# Patient Record
Sex: Female | Born: 1945 | Race: White | Hispanic: No | Smoking: Former smoker
Health system: Southern US, Community
[De-identification: ages and names within clinical notes are randomized; demographics above are authoritative.]

## PROBLEM LIST (undated history)

## (undated) DIAGNOSIS — L900 Lichen sclerosus et atrophicus: Secondary | ICD-10-CM

## (undated) DIAGNOSIS — M8580 Other specified disorders of bone density and structure, unspecified site: Secondary | ICD-10-CM

## (undated) DIAGNOSIS — C801 Malignant (primary) neoplasm, unspecified: Secondary | ICD-10-CM

## (undated) DIAGNOSIS — L409 Psoriasis, unspecified: Secondary | ICD-10-CM

## (undated) HISTORY — DX: Other specified disorders of bone density and structure, unspecified site: M85.80

## (undated) HISTORY — DX: Psoriasis, unspecified: L40.9

## (undated) HISTORY — PX: TUBAL LIGATION: SHX77

## (undated) HISTORY — DX: Malignant (primary) neoplasm, unspecified: C80.1

## (undated) HISTORY — DX: Lichen sclerosus et atrophicus: L90.0

## (undated) HISTORY — PX: BREAST SURGERY: SHX581

## (undated) HISTORY — PX: TONSILLECTOMY: SUR1361

---

## 1999-04-01 ENCOUNTER — Other Ambulatory Visit: Admission: RE | Admit: 1999-04-01 | Discharge: 1999-04-01 | Payer: Self-pay | Admitting: Obstetrics and Gynecology

## 1999-06-02 ENCOUNTER — Inpatient Hospital Stay (HOSPITAL_COMMUNITY): Admission: EM | Admit: 1999-06-02 | Discharge: 1999-06-03 | Payer: Self-pay | Admitting: Emergency Medicine

## 1999-06-02 ENCOUNTER — Encounter: Payer: Self-pay | Admitting: Emergency Medicine

## 1999-06-02 ENCOUNTER — Encounter: Payer: Self-pay | Admitting: Cardiology

## 1999-07-08 ENCOUNTER — Encounter: Payer: Self-pay | Admitting: Family Medicine

## 1999-07-08 ENCOUNTER — Encounter: Admission: RE | Admit: 1999-07-08 | Discharge: 1999-07-08 | Payer: Self-pay | Admitting: Family Medicine

## 2001-04-05 ENCOUNTER — Other Ambulatory Visit: Admission: RE | Admit: 2001-04-05 | Discharge: 2001-04-05 | Payer: Self-pay | Admitting: Obstetrics and Gynecology

## 2002-04-06 ENCOUNTER — Other Ambulatory Visit: Admission: RE | Admit: 2002-04-06 | Discharge: 2002-04-06 | Payer: Self-pay | Admitting: *Deleted

## 2003-04-09 ENCOUNTER — Other Ambulatory Visit: Admission: RE | Admit: 2003-04-09 | Discharge: 2003-04-09 | Payer: Self-pay | Admitting: Obstetrics and Gynecology

## 2004-04-10 ENCOUNTER — Other Ambulatory Visit: Admission: RE | Admit: 2004-04-10 | Discharge: 2004-04-10 | Payer: Self-pay | Admitting: Obstetrics and Gynecology

## 2004-04-23 ENCOUNTER — Encounter (INDEPENDENT_AMBULATORY_CARE_PROVIDER_SITE_OTHER): Payer: Self-pay | Admitting: Specialist

## 2004-04-23 ENCOUNTER — Ambulatory Visit (HOSPITAL_COMMUNITY): Admission: RE | Admit: 2004-04-23 | Discharge: 2004-04-23 | Payer: Self-pay | Admitting: Gastroenterology

## 2005-04-23 ENCOUNTER — Other Ambulatory Visit: Admission: RE | Admit: 2005-04-23 | Discharge: 2005-04-23 | Payer: Self-pay | Admitting: Obstetrics and Gynecology

## 2006-04-26 ENCOUNTER — Other Ambulatory Visit: Admission: RE | Admit: 2006-04-26 | Discharge: 2006-04-26 | Payer: Self-pay | Admitting: Obstetrics and Gynecology

## 2006-10-19 ENCOUNTER — Encounter: Admission: RE | Admit: 2006-10-19 | Discharge: 2006-10-19 | Payer: Self-pay | Admitting: Family Medicine

## 2007-04-28 ENCOUNTER — Other Ambulatory Visit: Admission: RE | Admit: 2007-04-28 | Discharge: 2007-04-28 | Payer: Self-pay | Admitting: Obstetrics and Gynecology

## 2008-05-09 ENCOUNTER — Encounter: Payer: Self-pay | Admitting: Obstetrics and Gynecology

## 2008-05-09 ENCOUNTER — Other Ambulatory Visit: Admission: RE | Admit: 2008-05-09 | Discharge: 2008-05-09 | Payer: Self-pay | Admitting: Obstetrics and Gynecology

## 2008-05-09 ENCOUNTER — Ambulatory Visit: Payer: Self-pay | Admitting: Obstetrics and Gynecology

## 2009-05-10 ENCOUNTER — Ambulatory Visit: Payer: Self-pay | Admitting: Obstetrics and Gynecology

## 2009-05-10 ENCOUNTER — Other Ambulatory Visit: Admission: RE | Admit: 2009-05-10 | Discharge: 2009-05-10 | Payer: Self-pay | Admitting: Obstetrics and Gynecology

## 2009-05-27 ENCOUNTER — Encounter: Admission: RE | Admit: 2009-05-27 | Discharge: 2009-05-27 | Payer: Self-pay | Admitting: Family Medicine

## 2010-01-13 ENCOUNTER — Ambulatory Visit (HOSPITAL_COMMUNITY): Admission: RE | Admit: 2010-01-13 | Discharge: 2010-01-13 | Payer: Self-pay | Admitting: Dermatology

## 2010-05-07 LAB — CBC
HCT: 31.7 % — ABNORMAL LOW (ref 36.0–46.0)
Hemoglobin: 9.8 g/dL — ABNORMAL LOW (ref 12.0–15.0)
MCH: 33.1 pg (ref 26.0–34.0)
MCHC: 30.9 g/dL (ref 30.0–36.0)
MCV: 107.1 fL — ABNORMAL HIGH (ref 78.0–100.0)
Platelets: 320 10*3/uL (ref 150–400)
RBC: 2.96 MIL/uL — ABNORMAL LOW (ref 3.87–5.11)
RDW: 22.3 % — ABNORMAL HIGH (ref 11.5–15.5)
WBC: 5.6 10*3/uL (ref 4.0–10.5)

## 2010-07-11 NOTE — H&P (Signed)
[facility]  Patient:    Sharon Guerra, Sharon Guerra                      MRN: [id number] Adm. Date:  [id number] Attending:  Doug Sou CC:         Redmond Baseman, M.D.                         History and Physical  CHIEF COMPLAINT:  Chest pain.  HISTORY OF PRESENT ILLNESS:  [name] is a 65 year old female who awoke suddenly at 5 a.m. this morning with substernal chest pain, described as severe  10/10.  She took Zantac and Maalox without relief.  She presented to the emergency room.  The substernal chest pain was associated with nausea and diaphoresis. She has not had any similar episodes of chest pain in the past.  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  She is not involved in an exercise program.  The coronary isk factors include postmenopausal status, a sedentary life style, positive family history.  Cholesterol is unknown.  FAMILY HISTORY:  Her father died at age 52 of a myocardial infarction.  Her grandmother died of sudden cardiac death.  PAST MEDICAL HISTORY:  Hypercholesterolemia.  CURRENT MEDICATIONS:  Evista 60 mg p.o. q.d.  ALLERGIES:  PENICILLIN.   She has hives.  PAST SURGICAL HISTORY:  Significant for a lumpectomy for breast cancer in 1997.  REVIEW OF SYSTEMS:  Significant for diaphoresis, occasional palpitations, but no tachyarrhythmia.  No pedal edema.  No orthopnea.  Presyncope in the past but no  syncope.  She is not involved in an exercise program.  No bright red blood per rectum or black tarry stools.  No hematemesis.  SOCIAL HISTORY:  She is married.  She has two children who are in good health. No history of alcohol, tobacco, or other drugs of abuse.  PHYSICAL EXAMINATION:  GENERAL:  A middle-aged female, currently pain-free.  VITAL SIGNS:  Blood pressure 138/60, heart rate 74.  She is afebrile.  HEENT:  Unremarkable.  NECK:  No neck vein distention, no carotid bruits.  Thyroid is not  palpable.  LUNGS:  Breath sounds equal and clear to auscultation.  CARDIOVASCULAR:  A normal S1, normal S2.  A regular rate and rhythm.  No rubs, murmurs, or gallops noted.  ABDOMEN:  Soft, benign.  Normal bowel sounds.  No epigastric tenderness.  EXTREMITIES:  Do not reveal any peripheral edema.  Distal pulses are palpable.  NEUROLOGIC:  Nonfocal.  SKIN:  Warm and dry.  NEUROLOGIC:  Motor is 5/5 throughout.  LABORATORY DATA:  Creatinine is 0.8, sodium 143, potassium 4.0, pH of 7.39. Hematocrit 42, white count 6.1, platelet count 224.  CPK total 81, CPK-MB 0.7, troponin I less than 0.03.  O2 saturation is 97% on room air.  Electrocardiogram reveals a normal sinus rhythm, normal electrocardiogram.  Chest x-ray:  No cardiomegaly.  Normal chest x-ray.  IMPRESSION: 1. Substernal chest pain in a middle-aged female with significant risk factors    for coronary artery disease, including a family history, postmenopausal status,    and age.  PLAN:  She will be admitted to the chest pain unit and a myocardial infarction ill be ruled out by cardiac enzymes.  If the serial cardiac enzymes are negative, a  stress Cardiolite will be planned for further evaluation.  Consider gastritis as the source.  The patient will be started on  Protonix 30 mg p.o. q.d.  2. Status post breast cancer.  PLAN:  She will be continued on her Evista.  In view of her normal electrocardiogram and normal cardiac enzymes, a heparin drip will not be started at this time.  HEALTH MAINTENANCE:  Fasting lipid profile, liver profile will be obtained. Further workup will be planned per the outcome of the above data. DD:  06/02/99 TD:  06/01/99 Job: [id number]

## 2010-07-11 NOTE — Op Note (Signed)
NAMEKHAYLEE, Sharon Guerra NO.:  [id number]   MEDICAL RECORD NO.:  [id number]          PATIENT TYPE:  AMB   LOCATION:  ENDO                         FACILITY[facility]  [facility]   PHYSICIAN:  James L. Malon Kindle., M.D.DATE OF BIRTH:  November 22, 1945   DATE OF PROCEDURE:  04/23/2004  DATE OF DISCHARGE:                                 OPERATIVE REPORT   PROCEDURE:  Esophagogastroduodenoscopy with biopsy.   MEDICATIONS:  Fentanyl 75 mcg, Versed 6 mg IV.   INDICATIONS FOR PROCEDURE:  Iron deficiency anemia of unknown cause.   DESCRIPTION OF PROCEDURE:  The procedure had been explained to the patient  and consent obtained.  In the left lateral decubitus position, the Olympus  scope was inserted and advanced. We were then able to advance easily into  the stomach. The gastric outlet identified and passed, the duodenum  including the bulb and second portion were seen well.  The scope was  withdrawn back into the stomach.  The small bowel mucosa had been entirely  normal and random biopsies were obtained to look for celiac disease. The  scope was withdrawn and the antrum and body were normal, fundus and cardia  seen well and were normal.  There were no ulcerations or other  abnormalities.  The patient had really minimal, if any, hiatal hernia, the  distal and proximal esophagus were endoscopically normal. The vocal cords  were seen well upon removal and were normal. The scope was withdrawn and  patient tolerated the procedure well.   ASSESSMENT:  Iron deficiency anemia, 280.0. No obvious cause on upper  endoscopy.   PLAN:  Will check path to rule out celiac disease and proceed with  colonoscopy at this time as planned.      JLE/MEDQ  D:  04/23/2004  T:  04/23/2004  Job:  [id number]   cc:   Rande Brunt. Eda Paschal, M.D.  [address], [address]  [place]  [place] [address]  Fax: [phone]   L. Lupe Carney, M.D.  [address] [place]  [place] [address]  Fax: [phone]

## 2010-07-11 NOTE — Op Note (Signed)
NAMESILVIE, OBREMSKI NO.:  [id number]   MEDICAL RECORD NO.:  [id number]          PATIENT TYPE:  AMB   LOCATION:  ENDO                         [facility]AC[facility]   PHYSICIAN:  James L. Malon Kindle., M.D.DATE OF BIRTH:  26-Mar-1945   DATE OF PROCEDURE:  04/23/2004  DATE OF DISCHARGE:                                 OPERATIVE REPORT   PROCEDURE:  Colonoscopy.   MEDICATIONS:  The patient received a total of fentanyl 100 mcg and Versed 10  mg IV for both procedures.   SCOPE:  Olympus pediatric colonoscope.   INDICATIONS FOR PROCEDURE:  Iron deficiency anemia with a family history of  colon cancer.   DESCRIPTION OF PROCEDURE:  The procedure had been explained to the patient  and consent obtained. An endoscopy was performed just before this procedure  was normal. The Olympus pediatric adjustable scope was inserted and  advanced. The prep was excellent. We were able to easily reach the cecum.  The ileocecal valve and appendiceal orifice were seen. The scope was  withdrawn and the cecum, ascending colon, transverse colon, splenic flexure,  descending and sigmoid colon were seen well, no polyps or other lesions were  seen. There was no diverticulosis.  The scope was withdrawn down in the  rectum, the rectum was free of polyps. The patient tolerated the procedure  well.   ASSESSMENT:  1.  Iron deficiency anemia, negative colonoscopy at this time.  2.  Family history of colon cancer, V16.0.   PLAN:  Will place the patient on slow FE b.i.d., see back in the office in 6-  8 weeks.  Recheck stools, iron studies and will likely recommend repeating  colonoscopy in five years due to her family history.      JLE/MEDQ  D:  04/23/2004  T:  04/23/2004  Job:  [id number]   cc:   Rande Brunt. Eda Paschal, M.D.  [address], [address]  [place]  [place] [address]  Fax: [phone]   L. Lupe Carney, M.D.  [address] [place]  [place] [address]  Fax: [phone]

## 2010-07-11 NOTE — Cardiovascular Report (Signed)
[place] [place] [facility]  Patient:    Sharon Guerra, Sharon Guerra                      MRN: [id number] Proc. Date: 06/03/99 Adm. Date:  [id number] Disc. Date: [id number] Attending:  Meade Maw A                        Cardiac Catheterization  PROCEDURE PERFORMED:  Left heart catheterization.  INDICATIONS FOR PROCEDURE:  Recurrent chest pain, significant cardiac risk factors.  DESCRIPTION OF PROCEDURE:  After obtaining written informed consent, and explaining options, the patient was brought to the cardiac catheterization lab in the postabsorptive state.  Preop sedation was achieved with IV Versed.  The right groin was prepped and draped in usual sterile fashion.   Local anesthesia was achieved using 1% Xylocaine.  A 6 French hemostasis sheath was placed into the right femoral artery using a modified Seldinger technique.  Selective coronary angiography was performed using the JL4, JR4 Judkins catheters.  Nonionic contrast was used and was hand injected.  Single plane ventriculogram was performed in the RAO position using a 6 French pigtail curved catheter.  Nonionic contrast was used for power injection.  The hemostasis sheath was flushed after each catheter exchange. All catheter exchange were made over guidewire.  On long review of the film, there as no identifiable coronary artery disease.  The hemostasis sheath was removed. Hemostasis was achieved using digital pressure.  FINDINGS:  Aorta pressure is 140/71, LV pressure is 140/9.  Single plane ventriculogram revealed normal wall motion with an ejection fraction of 65%.  CORONARY ANGIOGRAPHY:  Fluoroscopy revealed no significant calcification.  Left main coronary artery:  The left main coronary artery bifurcated into the left anterior descending and circumflex vessels.  There is no significant disease in the left main coronary artery.  Left anterior descending:  Left anterior descending gave rise to a  large diagonal #1 and large diagonal #2 and it is an apical recurrent branch.  There was no significant disease in the left anterior descending.  Circumflex vessel:  The circumflex vessel gave rise to a large OM-1, moderate OM-2 and it ______ vessels.  There was no significant disease in the circumflex or its vessels.  Right coronary artery:  The right coronary artery gave rise to a small RV marginal #1, moderate PDA and a PL branch.  There was no significant disease in the right coronary artery or its branches.  IMPRESSION:  Normal epicardial arteries, normal left ventricular function. Ejection fraction was approximately 65%.  RECOMMENDATIONS:  To consider other etiologies for chest pain.  The patient was  started on Protonix 40 mg p.o. q.d. DD:  06/03/99 TD:  06/04/99 Job: [id number]

## 2011-10-07 ENCOUNTER — Encounter: Payer: Self-pay | Admitting: Gynecology

## 2011-10-07 DIAGNOSIS — C801 Malignant (primary) neoplasm, unspecified: Secondary | ICD-10-CM | POA: Insufficient documentation

## 2011-10-07 DIAGNOSIS — M8580 Other specified disorders of bone density and structure, unspecified site: Secondary | ICD-10-CM | POA: Insufficient documentation

## 2011-10-07 DIAGNOSIS — L409 Psoriasis, unspecified: Secondary | ICD-10-CM | POA: Insufficient documentation

## 2011-10-14 DIAGNOSIS — Z1231 Encounter for screening mammogram for malignant neoplasm of breast: Secondary | ICD-10-CM | POA: Diagnosis not present

## 2011-10-15 ENCOUNTER — Encounter: Payer: Self-pay | Admitting: Obstetrics and Gynecology

## 2011-10-19 ENCOUNTER — Encounter: Payer: Self-pay | Admitting: Obstetrics and Gynecology

## 2011-10-19 ENCOUNTER — Ambulatory Visit (INDEPENDENT_AMBULATORY_CARE_PROVIDER_SITE_OTHER): Payer: Medicare Other | Admitting: Obstetrics and Gynecology

## 2011-10-19 VITALS — BP 130/80 | Ht 63.0 in | Wt 209.0 lb

## 2011-10-19 DIAGNOSIS — R351 Nocturia: Secondary | ICD-10-CM

## 2011-10-19 DIAGNOSIS — M8580 Other specified disorders of bone density and structure, unspecified site: Secondary | ICD-10-CM

## 2011-10-19 DIAGNOSIS — M899 Disorder of bone, unspecified: Secondary | ICD-10-CM | POA: Diagnosis not present

## 2011-10-19 DIAGNOSIS — M949 Disorder of cartilage, unspecified: Secondary | ICD-10-CM

## 2011-10-19 NOTE — Progress Notes (Signed)
Patient came to see me today for her annual GYN exam. 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 She said she stopped it a year ago for no apparent reason. She just had a mammogram. She is having no vaginal bleeding. She is having no pelvic pain. 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 She has had normal Pap smears since then. Her last Pap smear was 2012. She was doing bone densities at [facility] radiology.her last bone density was 2009. She was on Evista at that time. Her worst T score was -2.2. She is doing well without hormone replacement therapy. She was seeing  Dr. Mayford Knife for her psoriasis but has seen no one since he retired.  Physical examination:Kim Julian Reil present.HEENT within normal limits. Neck: Thyroid not large. No masses. Supraclavicular nodes: not enlarged. Breasts: Examined in both sitting and lying  position. No skin changes and no masses. Abdomen: Soft no guarding rebound or masses or hernia. Pelvic: External: Within normal limits. BUS: Within normal limits. Vaginal:within normal limits. Good estrogen effect. No evidence of cystocele rectocele or enterocele. Cervix: clean. Uterus: Normal size and shape. Adnexa: No masses. Rectovaginal exam: Confirmatory and negative. Extremities: Within normal limits.  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 #2. Osteopenia #3. History of CIN.  Plan: Continue yearly mammograms. Schedule bone density. Patient to start to see Dr. Yetta Barre for her skin. The new Pap smear guidelines were discussed with the patient. No pap done.

## 2011-10-19 NOTE — Patient Instructions (Signed)
Schedule bone density.    

## 2011-10-20 LAB — URINALYSIS W MICROSCOPIC + REFLEX CULTURE
Bilirubin Urine: NEGATIVE
Casts: NONE SEEN
Glucose, UA: NEGATIVE mg/dL
Hgb urine dipstick: NEGATIVE
pH: 5.5 (ref 5.0–8.0)

## 2011-10-22 ENCOUNTER — Other Ambulatory Visit: Payer: Self-pay | Admitting: Obstetrics and Gynecology

## 2011-10-22 DIAGNOSIS — N390 Urinary tract infection, site not specified: Secondary | ICD-10-CM

## 2011-10-22 MED ORDER — NITROFURANTOIN MONOHYD MACRO 100 MG PO CAPS: 100.0000 mg | ORAL_CAPSULE | Freq: Two times a day (BID) | ORAL | Status: AC

## 2011-10-23 ENCOUNTER — Encounter: Payer: Self-pay | Admitting: Obstetrics and Gynecology

## 2011-11-10 ENCOUNTER — Other Ambulatory Visit: Payer: Medicare Other

## 2011-11-10 ENCOUNTER — Ambulatory Visit (INDEPENDENT_AMBULATORY_CARE_PROVIDER_SITE_OTHER): Payer: Medicare Other

## 2011-11-10 DIAGNOSIS — M899 Disorder of bone, unspecified: Secondary | ICD-10-CM

## 2011-11-10 DIAGNOSIS — N390 Urinary tract infection, site not specified: Secondary | ICD-10-CM | POA: Diagnosis not present

## 2011-11-10 DIAGNOSIS — M949 Disorder of cartilage, unspecified: Secondary | ICD-10-CM | POA: Diagnosis not present

## 2011-11-10 DIAGNOSIS — M8580 Other specified disorders of bone density and structure, unspecified site: Secondary | ICD-10-CM

## 2011-11-11 LAB — URINALYSIS W MICROSCOPIC + REFLEX CULTURE
Bilirubin Urine: NEGATIVE
Casts: NONE SEEN
Crystals: NONE SEEN
Ketones, ur: NEGATIVE mg/dL
Specific Gravity, Urine: 1.023 (ref 1.005–1.030)
WBC, UA: >50 WBC/hpf — AB (ref <3)
pH: 5 (ref 5.0–8.0)

## 2012-07-20 DIAGNOSIS — L408 Other psoriasis: Secondary | ICD-10-CM | POA: Diagnosis not present

## 2012-10-10 ENCOUNTER — Encounter: Payer: Self-pay | Admitting: Gynecology

## 2012-10-10 ENCOUNTER — Ambulatory Visit (INDEPENDENT_AMBULATORY_CARE_PROVIDER_SITE_OTHER): Payer: Medicare Other | Admitting: Gynecology

## 2012-10-10 VITALS — BP 128/78

## 2012-10-10 DIAGNOSIS — N9089 Other specified noninflammatory disorders of vulva and perineum: Secondary | ICD-10-CM | POA: Insufficient documentation

## 2012-10-10 DIAGNOSIS — L293 Anogenital pruritus, unspecified: Secondary | ICD-10-CM | POA: Diagnosis not present

## 2012-10-10 DIAGNOSIS — B3749 Other urogenital candidiasis: Secondary | ICD-10-CM

## 2012-10-10 DIAGNOSIS — R300 Dysuria: Secondary | ICD-10-CM | POA: Diagnosis not present

## 2012-10-10 DIAGNOSIS — N898 Other specified noninflammatory disorders of vagina: Secondary | ICD-10-CM

## 2012-10-10 DIAGNOSIS — L940 Localized scleroderma [morphea]: Secondary | ICD-10-CM | POA: Diagnosis not present

## 2012-10-10 LAB — URINALYSIS W MICROSCOPIC + REFLEX CULTURE
Crystals: NONE SEEN
Ketones, ur: NEGATIVE mg/dL
Nitrite: NEGATIVE
Specific Gravity, Urine: 1.025 (ref 1.005–1.030)
Urobilinogen, UA: 1 mg/dL (ref 0.0–1.0)

## 2012-10-10 LAB — WET PREP FOR TRICH, YEAST, CLUE: Clue Cells Wet Prep HPF POC: NONE SEEN

## 2012-10-10 MED ORDER — URIBEL 118 MG PO CAPS: 118.0000 mg | ORAL_CAPSULE | Freq: Four times a day (QID) | ORAL | Status: DC

## 2012-10-10 MED ORDER — NITROFURANTOIN MONOHYD MACRO 100 MG PO CAPS: 100.0000 mg | ORAL_CAPSULE | Freq: Two times a day (BID) | ORAL | Status: DC

## 2012-10-10 MED ORDER — CLOBETASOL PROPIONATE 0.05 % EX CREA: TOPICAL_CREAM | Freq: Two times a day (BID) | CUTANEOUS | Status: DC

## 2012-10-10 NOTE — Progress Notes (Signed)
67 year old patient who presented to the office complaining of urinary frequency and dysuria and not completely empty her bladder at times. She denied any back pain, fever, chills, nausea, or vomiting. Patient also complaining of significant amount of vulvar pruritus but no discharge.  Physical Exam  Genitourinary:     speculum exam only demonstrated vaginal atrophy but no true lesion. Wet prep was negative. A vulvar biopsy was obtained from the inferior portion of the right labia majora after the area was cleansed with Betadine solution. One percent lidocaine was infiltrated subdermally and a keypunch biopsy was undertaken specimen was submitted for pathological evaluation. Silver nitrate was used for hemostasis.  Urinalysis: The WBC 7-10, RBC 11-20, bacteria few  Assessment/plan: #1 urinary tract infection will be treated with Macrobid one by mouth twice a day for 7 days. Prescription for Uribel one by mouth 4 times a day for 2 days for her dysuria. 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 She is scheduled to see me next month for her annual exam and we will reassess again. Will notify her as soon as the results of the biopsy are available.

## 2012-10-10 NOTE — Patient Instructions (Addendum)
Lichen Sclerosus Lichen sclerosus is a skin problem. It can happen on any part of the body, but it commonly involves the anal or genital areas. Lichen sclerosus is not an infection or a fungus. Girls and women are more commonly affected than boys and men. CAUSES The cause is not known. It could be the result of an overactive immune system or a lack of certain hormones. Lichen sclerosus is not passed from one person to another (not contagious). SYMPTOMS Your skin may have:  Thin, wrinkled, white areas.  Thickened white areas.  Red and swollen patches.  Tears or cracks.  Bruising.  Blood blisters.  Severe itching. You may also have pain, itching, or burning with urination. Constipation is also common in people with lichen sclerosus. DIAGNOSIS Your caregiver will do a physical exam. Sometimes, a tissue sample (biopsy) may be sent for testing. TREATMENT Treatment may involve putting a thin layer of medicated cream (topical steroid) over the areas with lichen sclerosus. Use the cream only as directed by your caregiver.  HOME CARE INSTRUCTIONS  Only take over-the-counter or prescription medicines as directed by your caregiver.  Keep the vaginal area as clean and dry as possible. SEEK MEDICAL CARE IF: You develop increasing pain, swelling, or redness. Document Released: 07/02/2010 Document Revised: 05/04/2011 Document Reviewed: 07/02/2010 [facility] Patient Information 2014 [place], [place].  Urinary Tract Infection Urinary tract infections (UTIs) can develop anywhere along your urinary tract. Your urinary tract is your body's drainage system for removing wastes and extra water. Your urinary tract includes two kidneys, two ureters, a bladder, and a urethra. Your kidneys are a pair of bean-shaped organs. Each kidney is about the size of your fist. They are located below your ribs, one on each side of your spine. CAUSES Infections are caused by microbes, which are microscopic organisms,  including fungi, viruses, and bacteria. These organisms are so small that they can only be seen through a microscope. Bacteria are the microbes that most commonly cause UTIs. SYMPTOMS  Symptoms of UTIs may vary by age and gender of the patient and by the location of the infection. Symptoms in young women typically include a frequent and intense urge to urinate and a painful, burning feeling in the bladder or urethra during urination. Older women and men are more likely to be tired, shaky, and weak and have muscle aches and abdominal pain. A fever may mean the infection is in your kidneys. Other symptoms of a kidney infection include pain in your back or sides below the ribs, nausea, and vomiting. DIAGNOSIS To diagnose a UTI, your caregiver will ask you about your symptoms. Your caregiver also will ask to provide a urine sample. The urine sample will be tested for bacteria and white blood cells. White blood cells are made by your body to help fight infection. TREATMENT  Typically, UTIs can be treated with medication. Because most UTIs are caused by a bacterial infection, they usually can be treated with the use of antibiotics. The choice of antibiotic and length of treatment depend on your symptoms and the type of bacteria causing your infection. HOME CARE INSTRUCTIONS  If you were prescribed antibiotics, take them exactly as your caregiver instructs you. Finish the medication even if you feel better after you have only taken some of the medication.  Drink enough water and fluids to keep your urine clear or pale yellow.  Avoid caffeine, tea, and carbonated beverages. They tend to irritate your bladder.  Empty your bladder often. Avoid holding urine for  long periods of time.  Empty your bladder before and after sexual intercourse.  After a bowel movement, women should cleanse from front to back. Use each tissue only once. SEEK MEDICAL CARE IF:   You have back pain.  You develop a fever.  Your  symptoms do not begin to resolve within 3 days. SEEK IMMEDIATE MEDICAL CARE IF:   You have severe back pain or lower abdominal pain.  You develop chills.  You have nausea or vomiting.  You have continued burning or discomfort with urination. MAKE SURE YOU:   Understand these instructions.  Will watch your condition.  Will get help right away if you are not doing well or get worse. Document Released: 11/19/2004 Document Revised: 08/11/2011 Document Reviewed: 03/20/2011 [facility] Patient Information 2014 [place], [place].

## 2012-10-14 DIAGNOSIS — Z1231 Encounter for screening mammogram for malignant neoplasm of breast: Secondary | ICD-10-CM | POA: Diagnosis not present

## 2012-10-17 ENCOUNTER — Encounter: Payer: Self-pay | Admitting: Gynecology

## 2012-10-21 DIAGNOSIS — L940 Localized scleroderma [morphea]: Secondary | ICD-10-CM | POA: Diagnosis not present

## 2012-10-21 DIAGNOSIS — L570 Actinic keratosis: Secondary | ICD-10-CM | POA: Diagnosis not present

## 2012-10-21 DIAGNOSIS — L408 Other psoriasis: Secondary | ICD-10-CM | POA: Diagnosis not present

## 2012-10-25 DIAGNOSIS — L408 Other psoriasis: Secondary | ICD-10-CM | POA: Diagnosis not present

## 2012-11-04 ENCOUNTER — Encounter: Payer: Self-pay | Admitting: Gynecology

## 2012-11-04 ENCOUNTER — Ambulatory Visit (INDEPENDENT_AMBULATORY_CARE_PROVIDER_SITE_OTHER): Payer: Medicare Other | Admitting: Gynecology

## 2012-11-04 VITALS — BP 128/88 | Ht 60.5 in | Wt 213.0 lb

## 2012-11-04 DIAGNOSIS — Z853 Personal history of malignant neoplasm of breast: Secondary | ICD-10-CM

## 2012-11-04 DIAGNOSIS — L940 Localized scleroderma [morphea]: Secondary | ICD-10-CM | POA: Diagnosis not present

## 2012-11-04 DIAGNOSIS — Z23 Encounter for immunization: Secondary | ICD-10-CM

## 2012-11-04 DIAGNOSIS — M899 Disorder of bone, unspecified: Secondary | ICD-10-CM | POA: Diagnosis not present

## 2012-11-04 DIAGNOSIS — L408 Other psoriasis: Secondary | ICD-10-CM | POA: Diagnosis not present

## 2012-11-04 DIAGNOSIS — L900 Lichen sclerosus et atrophicus: Secondary | ICD-10-CM | POA: Insufficient documentation

## 2012-11-04 DIAGNOSIS — Z1159 Encounter for screening for other viral diseases: Secondary | ICD-10-CM | POA: Diagnosis not present

## 2012-11-04 DIAGNOSIS — M8580 Other specified disorders of bone density and structure, unspecified site: Secondary | ICD-10-CM

## 2012-11-04 DIAGNOSIS — K5900 Constipation, unspecified: Secondary | ICD-10-CM

## 2012-11-04 DIAGNOSIS — L409 Psoriasis, unspecified: Secondary | ICD-10-CM

## 2012-11-04 LAB — HEPATITIS C ANTIBODY: HCV Ab: NEGATIVE

## 2012-11-04 MED ORDER — LINACLOTIDE 145 MCG PO CAPS: 145.0000 ug | ORAL_CAPSULE | Freq: Every day | ORAL | Status: DC

## 2012-11-04 NOTE — Patient Instructions (Addendum)
Shingles Shingles (herpes zoster) is an infection that is caused by the same virus that causes chickenpox (varicella). The infection causes a painful skin rash and fluid-filled blisters, which eventually break open, crust over, and heal. It may occur in any area of the body, but it usually affects only one side of the body or face. The pain of shingles usually lasts about 1 month. However, some people with shingles may develop long-term (chronic) pain in the affected area of the body. Shingles often occurs many years after the person had chickenpox. It is more common:  In people older than 50 years.  In people with weakened immune systems, such as those with HIV, AIDS, or cancer.  In people taking medicines that weaken the immune system, such as transplant medicines.  In people under great stress. CAUSES  Shingles is caused by the varicella zoster virus (VZV), which also causes chickenpox. After a person is infected with the virus, it can remain in the person's body for years in an inactive state (dormant). To cause shingles, the virus reactivates and breaks out as an infection in a nerve root. The virus can be spread from person to person (contagious) through contact with open blisters of the shingles rash. It will only spread to people who have not had chickenpox. When these people are exposed to the virus, they may develop chickenpox. They will not develop shingles. Once the blisters scab over, the person is no longer contagious and cannot spread the virus to others. SYMPTOMS  Shingles shows up in stages. The initial symptoms may be pain, itching, and tingling in an area of the skin. This pain is usually described as burning, stabbing, or throbbing.In a few days or weeks, a painful red rash will appear in the area where the pain, itching, and tingling were felt. The rash is usually on one side of the body in a band or belt-like pattern. Then, the rash usually turns into fluid-filled blisters. They  will scab over and dry up in approximately 2 3 weeks. Flu-like symptoms may also occur with the initial symptoms, the rash, or the blisters. These may include:  Fever.  Chills.  Headache.  Upset stomach. DIAGNOSIS  Your caregiver will perform a skin exam to diagnose shingles. Skin scrapings or fluid samples may also be taken from the blisters. This sample will be examined under a microscope or sent to a lab for further testing. TREATMENT  There is no specific cure for shingles. Your caregiver will likely prescribe medicines to help you manage the pain, recover faster, and avoid long-term problems. This may include antiviral drugs, anti-inflammatory drugs, and pain medicines. HOME CARE INSTRUCTIONS   Take a cool bath or apply cool compresses to the area of the rash or blisters as directed. This may help with the pain and itching.   Only take over-the-counter or prescription medicines as directed by your caregiver.   Rest as directed by your caregiver.  Keep your rash and blisters clean with mild soap and cool water or as directed by your caregiver.  Do not pick your blisters or scratch your rash. Apply an anti-itch cream or numbing creams to the affected area as directed by your caregiver.  Keep your shingles rash covered with a loose bandage (dressing).  Avoid skin contact with:  Babies.   Pregnant women.   Children with eczema.   Elderly people with transplants.   People with chronic illnesses, such as leukemia or AIDS.   Wear loose-fitting clothing to help ease   the pain of material rubbing against the rash.  Keep all follow-up appointments with your caregiver.If the area involved is on your face, you may receive a referral for follow-up to a specialist, such as an eye doctor (ophthalmologist) or an ear, nose, and throat (ENT) doctor. Keeping all follow-up appointments will help you avoid eye complications, chronic pain, or disability.  SEEK IMMEDIATE MEDICAL  CARE IF:   You have facial pain, pain around the eye area, or loss of feeling on one side of your face.  You have ear pain or ringing in your ear.  You have loss of taste.  Your pain is not relieved with prescribed medicines.   Your redness or swelling spreads.   You have more pain and swelling.  Your condition is worsening or has changed.   You have a feveror persistent symptoms for more than 2 3 days.  You have a fever and your symptoms suddenly get worse. MAKE SURE YOU:  Understand these instructions.  Will watch your condition.  Will get help right away if you are not doing well or get worse. Document Released: 02/09/2005 Document Revised: 11/04/2011 Document Reviewed: 09/24/2011 [facility] Patient Information 2014 [place], [place]. Tetanus, Diphtheria, Pertussis (Tdap) Vaccine What You Need to Know WHY GET VACCINATED? Tetanus, diphtheria and pertussis can be very serious diseases, even for adolescents and adults. Tdap vaccine can protect [place] from these diseases. TETANUS (Lockjaw) causes painful muscle tightening and stiffness, usually all over the body.  It can lead to tightening of muscles in the head and neck so you can't open your mouth, swallow, or sometimes even breathe. Tetanus kills about 1 out of 5 people who are infected. DIPHTHERIA can cause a thick coating to form in the back of the throat.  It can lead to breathing problems, paralysis, heart failure, and death. PERTUSSIS (Whooping Cough) causes severe coughing spells, which can cause difficulty breathing, vomiting and disturbed sleep.  It can also lead to weight loss, incontinence, and rib fractures. Up to 2 in 100 adolescents and 5 in 100 adults with pertussis are hospitalized or have complications, which could include pneumonia and death. These diseases are caused by bacteria. Diphtheria and pertussis are spread from person to person through coughing or sneezing. Tetanus enters the body through cuts,  scratches, or wounds. Before vaccines, the [place] saw as many as 200,000 cases a year of diphtheria and pertussis, and hundreds of cases of tetanus. Since vaccination began, tetanus and diphtheria have dropped by about 99% and pertussis by about 80%. TDAP VACCINE Tdap vaccine can protect adolescents and adults from tetanus, diphtheria, and pertussis. One dose of Tdap is routinely given at age 87 or 71. People who did not get Tdap at that age should get it as soon as possible. Tdap is especially important for health care professionals and anyone having close contact with a baby younger than 12 months. Pregnant women should get a dose of Tdap during every pregnancy, to protect the newborn from pertussis. Infants are most at risk for severe, life-threatening complications from pertussis. A similar vaccine, called Td, protects from tetanus and diphtheria, but not pertussis. A Td booster should be given every 10 years. Tdap may be given as one of these boosters if you have not already gotten a dose. Tdap may also be given after a severe cut or burn to prevent tetanus infection. Your doctor can give you more information. Tdap may safely be given at the same time as other vaccines. SOME PEOPLE SHOULD NOT  GET THIS VACCINE  If you ever had a life-threatening allergic reaction after a dose of any tetanus, diphtheria, or pertussis containing vaccine, OR if you have a severe allergy to any part of this vaccine, you should not get Tdap. Tell your doctor if you have any severe allergies.  If you had a coma, or long or multiple seizures within 7 days after a childhood dose of DTP or DTaP, you should not get Tdap, unless a cause other than the vaccine was found. You can still get Td.  Talk to your doctor if you:  have epilepsy or another nervous system problem,  had severe pain or swelling after any vaccine containing diphtheria, tetanus or pertussis,  ever had Guillain-Barr Syndrome (GBS),  aren't  feeling well on the day the shot is scheduled. RISKS OF A VACCINE REACTION With any medicine, including vaccines, there is a chance of side effects. These are usually mild and go away on their own, but serious reactions are also possible. Brief fainting spells can follow a vaccination, leading to injuries from falling. Sitting or lying down for about 15 minutes can help prevent these. Tell your doctor if you feel dizzy or light-headed, or have vision changes or ringing in the ears. Mild problems following Tdap (Did not interfere with activities)  Pain where the shot was given (about 3 in 4 adolescents or 2 in 3 adults)  Redness or swelling where the shot was given (about 1 person in 5)  Mild fever of at least 100.50F (up to about 1 in 25 adolescents or 1 in 100 adults)  Headache (about 3 or 4 people in 10)  Tiredness (about 1 person in 3 or 4)  Nausea, vomiting, diarrhea, stomach ache (up to 1 in 4 adolescents or 1 in 10 adults)  Chills, body aches, sore joints, rash, swollen glands (uncommon) Moderate problems following Tdap (Interfered with activities, but did not require medical attention)  Pain where the shot was given (about 1 in 5 adolescents or 1 in 100 adults)  Redness or swelling where the shot was given (up to about 1 in 16 adolescents or 1 in 25 adults)  Fever over 102F (about 1 in 100 adolescents or 1 in 250 adults)  Headache (about 3 in 20 adolescents or 1 in 10 adults)  Nausea, vomiting, diarrhea, stomach ache (up to 1 or 3 people in 100)  Swelling of the entire arm where the shot was given (up to about 3 in 100). Severe problems following Tdap (Unable to perform usual activities, required medical attention)  Swelling, severe pain, bleeding and redness in the arm where the shot was given (rare). A severe allergic reaction could occur after any vaccine (estimated less than 1 in a million doses). WHAT IF THERE IS A SERIOUS REACTION? What should I look for?  Look  for anything that concerns you, such as signs of a severe allergic reaction, very high fever, or behavior changes. Signs of a severe allergic reaction can include hives, swelling of the face and throat, difficulty breathing, a fast heartbeat, dizziness, and weakness. These would start a few minutes to a few hours after the vaccination. What should I do?  If you think it is a severe allergic reaction or other emergency that can't wait, call 9-1-1 or get the person to the nearest hospital. Otherwise, call your doctor.  Afterward, the reaction should be reported to the "Vaccine Adverse Event Reporting System" (VAERS). Your doctor might file this report, or you can do it  yourself through the VAERS web site at www.vaers.LAgents.no, or by calling 1-9392628884. VAERS is only for reporting reactions. They do not give medical advice.  THE NATIONAL VACCINE INJURY COMPENSATION PROGRAM The National Vaccine Injury Compensation Program (VICP) is a federal program that was created to compensate people who may have been injured by certain vaccines. Persons who believe they may have been injured by a vaccine can learn about the program and about filing a claim by calling 1-(414) 125-6548 or visiting the VICP website at SpiritualWord.at. HOW CAN I LEARN MORE?  Ask your doctor.  Call your local or state health department.  Contact the Centers for Disease Control and Prevention (CDC):  Call 636-804-7568 or visit CDC's website at PicCapture.uy. CDC Tdap Vaccine VIS (07/02/11) Document Released: 08/11/2011 Document Revised: 11/04/2011 Document Reviewed: 08/11/2011 [employer] Patient Information 2014 [place], [place]. Constipation, Adult Constipation is when a person has fewer than 3 bowel movements a week; has difficulty having a bowel movement; or has stools that are dry, hard, or larger than normal. As people grow older, constipation is more common. If you try to fix constipation with medicines  that make you have a bowel movement (laxatives), the problem may get worse. Long-term laxative use may cause the muscles of the colon to become weak. A low-fiber diet, not taking in enough fluids, and taking certain medicines may make constipation worse. CAUSES   Certain medicines, such as antidepressants, pain medicine, iron supplements, antacids, and water pills.   Certain diseases, such as diabetes, irritable bowel syndrome (IBS), thyroid disease, or depression.   Not drinking enough water.   Not eating enough fiber-rich foods.   Stress or travel.  Lack of physical activity or exercise.  Not going to the restroom when there is the urge to have a bowel movement.  Ignoring the urge to have a bowel movement.  Using laxatives too much. SYMPTOMS   Having fewer than 3 bowel movements a week.   Straining to have a bowel movement.   Having hard, dry, or larger than normal stools.   Feeling full or bloated.   Pain in the lower abdomen.  Not feeling relief after having a bowel movement. DIAGNOSIS  Your caregiver will take a medical history and perform a physical exam. Further testing may be done for severe constipation. Some tests may include:   A barium enema X-ray to examine your rectum, colon, and sometimes, your small intestine.  A sigmoidoscopy to examine your lower colon.  A colonoscopy to examine your entire colon. TREATMENT  Treatment will depend on the severity of your constipation and what is causing it. Some dietary treatments include drinking more fluids and eating more fiber-rich foods. Lifestyle treatments may include regular exercise. If these diet and lifestyle recommendations do not help, your caregiver may recommend taking over-the-counter laxative medicines to help you have bowel movements. Prescription medicines may be prescribed if over-the-counter medicines do not work.  HOME CARE INSTRUCTIONS   Increase dietary fiber in your diet, such as fruits,  vegetables, whole grains, and beans. Limit high-fat and processed sugars in your diet, such as [place] fries, hamburgers, cookies, candies, and soda.   A fiber supplement may be added to your diet if you cannot get enough fiber from foods.   Drink enough fluids to keep your urine clear or pale yellow.   Exercise regularly or as directed by your caregiver.   Go to the restroom when you have the urge to go. Do not hold it.  Only take medicines as directed  by your caregiver. Do not take other medicines for constipation without talking to your caregiver first. SEEK IMMEDIATE MEDICAL CARE IF:   You have bright red blood in your stool.   Your constipation lasts for more than 4 days or gets worse.   You have abdominal or rectal pain.   You have thin, pencil-like stools.  You have unexplained weight loss. MAKE SURE YOU:   Understand these instructions.  Will watch your condition.  Will get help right away if you are not doing well or get worse. Document Released: 11/08/2003 Document Revised: 05/04/2011 Document Reviewed: 01/13/2011 [facility] Patient Information 2014 [place], [place]. Linaclotide oral capsules What is this medicine? LINACLOTIDE (lin a KLOE tide) is used to treat irritable bowel syndrome (IBS) with constipation as the main problem. It may also be used for relief of chronic constipation. This medicine may be used for other purposes; ask your health care provider or pharmacist if you have questions. What should I tell my health care provider before I take this medicine? They need to know if you have any of these conditions: -history of stool (fecal) impaction -now have diarrhea or have diarrhea often -other medical condition -stomach or intestinal disease, including bowel obstruction or abdominal adhesions -an unusual or allergic reaction to linaclotide, other medicines, foods, dyes, or preservatives -pregnant or trying to get pregnant -breast-feeding How should I  use this medicine? Take this medicine by mouth with a glass of water. Follow the directions on the prescription label. Do not cut, crush or chew this medicine. Take on an empty stomach, at least 30 minutes before your first meal of the day. Take your medicine at regular intervals. Do not take your medicine more often than directed. Do not stop taking except on your doctor's advice. A special MedGuide will be given to you by the pharmacist with each prescription and refill. Be sure to read this information carefully each time. Talk to your pediatrician regarding the use of this medicine in children. This medicine is not approved for use in children. Overdosage: If you think you've taken too much of this medicine contact a poison control center or emergency room at once. Overdosage: If you think you have taken too much of this medicine contact a poison control center or emergency room at once. NOTE: This medicine is only for you. Do not share this medicine with others. What if I miss a dose? If you miss a dose, just skip that dose. Wait until your next dose, and take only that dose. Do not take double or extra doses. What may interact with this medicine? -certain medicines for bowel problems or bladder incontinence (these can cause constipation) This list may not describe all possible interactions. Give your health care provider a list of all the medicines, herbs, non-prescription drugs, or dietary supplements you use. Also tell them if you smoke, drink alcohol, or use illegal drugs. Some items may interact with your medicine. What should I watch for while using this medicine? Visit your doctor for regular check ups. Tell your doctor if your symptoms do not get better or if they get worse. Diarrhea is a common side effect of this medicine. It often begins within 2 weeks of starting this medicine. Stop taking this medicine and call your doctor if you get severe diarrhea. Stop taking this medicine and  call your doctor or go to the nearest hospital emergency room right away if you develop unusual or severe stomach-area (abdominal) pain, especially if you also have bright  red, bloody stools or black stools that look like tar. What side effects may I notice from receiving this medicine? Side effects that you should report to your doctor or health care professional as soon as possible: -allergic reactions like skin rash, itching or hives, swelling of the face, lips, or tongue -black, tarry stools -bloody or watery diarrhea -new or worsening stomach pain -severe or prolonged diarrhea  Side effects that usually do not require medical attention (Report these to your doctor or health care professional if they continue or are bothersome.): -bloating -gas -loose stools This list may not describe all possible side effects. Call your doctor for medical advice about side effects. You may report side effects to FDA at 1-800-FDA-1088. Where should I keep my medicine? Keep out of the reach of children. Store at room temperature between 20 and 25 degrees C (68 and 77 degrees F). Keep this medicine in the original container. Keep tightly closed in a dry place. Do not remove the desiccant packet from the bottle, it helps to protect your medicine from moisture. Throw away any unused medicine after the expiration date. NOTE: This sheet is a summary. It may not cover all possible information. If you have questions about this medicine, talk to your doctor, pharmacist, or health care provider.  2013, [employer]. (10/28/2010 1:05:27 PM)

## 2012-11-04 NOTE — Progress Notes (Signed)
Sharon Guerra Mar 30, 1945 [id number]   History:    67 y.o.  For followup and GYN exam. Patient was seen in the office on August 18 of this year where she was treated for urinary tract infection but examination had demonstrated flat leukoplakic area around both labia majora suspicious for lichen sclerosis and a biopsy was undertaken. The pathology reported demonstrated the following:  Diagnosis Vulva, biopsy, right labia majora - LICHEN SCLEROSIS. - NO DYSPLASIA OR MALIGNANCY IDENTIFIED.  The patient was started on clobetasol cream 0.05% twice a day  which she is currently on. 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 Patient had been taking Evista but ran out of the prescription and is currently not taking it. Patient had a normal 3-D mammogram in March of this year.She is having no pelvic pain. 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 She has had normal Pap smears since then. Her last Pap smear was 2012. She was doing bone densities at [facility] radiology/ patient's most recent bone density study done here in our office in 2013 demonstrated stable bone mineralization with the lowest T score of the AP spine and -2.1. Her FRAX analysis was normal. She has been followed by a dermatologist for her psoriasis who is also prescribed clobetasol palpable. Patient had a normal colonoscopy in 2006. Patient complaining of constipation. Patient has not received his shingles vaccine or the Tdap vaccine yet.   Past medical history,surgical history, family history and social history were all reviewed and documented in the EPIC chart.  Gynecologic History No LMP recorded. Patient is postmenopausal. Contraception: post menopausal status Last Pap: 2012. Results were: normal Last mammogram: 2014. Results were: normal  Obstetric History OB History  Gravida Para Term Preterm AB  SAB TAB Ectopic Multiple Living  2 2 2       2     # Outcome Date GA Lbr Len/2nd Weight Sex Delivery Anes PTL Lv  2 TRM           1 TRM                ROS: A ROS was performed and pertinent positives and negatives are included in the history.  GENERAL: No fevers or chills. HEENT: No change in vision, no earache, sore throat or sinus congestion. NECK: No pain or stiffness. CARDIOVASCULAR: No chest pain or pressure. No palpitations. PULMONARY: No shortness of breath, cough or wheeze. GASTROINTESTINAL: No abdominal pain, nausea, vomiting or diarrhea, melena or bright red blood per rectum. GENITOURINARY: No urinary frequency, urgency, hesitancy or dysuria. MUSCULOSKELETAL: No joint or muscle pain, no back pain, no recent trauma. DERMATOLOGIC: No rash, no itching, no lesions. ENDOCRINE: No polyuria, polydipsia, no heat or cold intolerance. No recent change in weight. HEMATOLOGICAL: No anemia or easy bruising or bleeding. NEUROLOGIC: No headache, seizures, numbness, tingling or weakness. PSYCHIATRIC: No depression, no loss of interest in normal activity or change in sleep pattern.     Exam: chaperone present  BP 128/88  Ht 5' 0.5" (1.537 m)  Wt 213 lb (96.616 kg)  BMI 40.9 kg/m2  Body mass index is 40.9 kg/(m^2).  General appearance : Well developed well nourished female. No acute distress HEENT: Neck supple, trachea midline, no carotid bruits, no thyroidmegaly Lungs: Clear to auscultation, no rhonchi or wheezes, or rib retractions  Heart: Regular rate and rhythm, no murmurs or gallops Breast:Examined in sitting and  supine position were symmetrical in appearance, no palpable masses or tenderness,  no skin retraction, no nipple inversion, no nipple discharge, no skin discoloration, no axillary or supraclavicular lymphadenopathy Abdomen: no palpable masses or tenderness, no rebound or guarding Extremities: no edema or skin discoloration or tenderness  Pelvic:  Bartholin, Urethra, Skene  Glands: Within normal limits             Vagina: No gross lesions or discharge,vaginal atrophy  Cervix: No gross lesions or discharge  Uterus  anteverted, normal size, shape and consistency, non-tender and mobile  Adnexa  Without masses or tenderness  Anus and perineum  normal   Rectovaginal  normal sphincter tone without palpated masses or tenderness             Hemoccult course provided     Assessment/Plan:  67 y.o. female with good response from her lichen sclerosis. She will continue to apply the clobetasol twice a week. She was reminded to follow up with Dr. Lupe Carney her primary physician for her blood work. We rediscussed the report of the Evista 60 mg to take 1 by mouth daily which was prescribed. We discussed importance of calcium vitamin D and regular exercise for osteoporosis prevention. She received a Tdap vaccine today. Prescription for shingles faxing provided. Hemoccult card provided for her to submit to the office for testing at a later date. Pap smear not done, the new guidelines were discussed.  New CDC guidelines is recommending patients be tested once in her lifetime for hepatitis C antibody who were born between [birth year] through 1964. This was discussed with the patient today and has agreed to be tested today.  Patient was given a prescription for Linzess  145 mcg capsule to take 1 by mouth 30 minutes before first the O. The day and to increase her fluid intake to help with her constipation.  Ok Edwards MD, 1:23 PM 11/04/2012

## 2012-11-16 ENCOUNTER — Encounter: Payer: Self-pay | Admitting: Obstetrics and Gynecology

## 2013-01-12 DIAGNOSIS — L408 Other psoriasis: Secondary | ICD-10-CM | POA: Diagnosis not present

## 2013-01-23 DIAGNOSIS — L408 Other psoriasis: Secondary | ICD-10-CM | POA: Diagnosis not present

## 2013-01-25 DIAGNOSIS — L408 Other psoriasis: Secondary | ICD-10-CM | POA: Diagnosis not present

## 2013-01-27 DIAGNOSIS — L408 Other psoriasis: Secondary | ICD-10-CM | POA: Diagnosis not present

## 2013-01-30 DIAGNOSIS — L408 Other psoriasis: Secondary | ICD-10-CM | POA: Diagnosis not present

## 2013-02-01 DIAGNOSIS — L408 Other psoriasis: Secondary | ICD-10-CM | POA: Diagnosis not present

## 2013-02-03 ENCOUNTER — Encounter: Payer: Self-pay | Admitting: Gynecology

## 2013-02-03 ENCOUNTER — Ambulatory Visit (INDEPENDENT_AMBULATORY_CARE_PROVIDER_SITE_OTHER): Payer: Medicare Other | Admitting: Gynecology

## 2013-02-03 VITALS — BP 124/80

## 2013-02-03 DIAGNOSIS — M899 Disorder of bone, unspecified: Secondary | ICD-10-CM | POA: Diagnosis not present

## 2013-02-03 DIAGNOSIS — N9089 Other specified noninflammatory disorders of vulva and perineum: Secondary | ICD-10-CM | POA: Diagnosis not present

## 2013-02-03 DIAGNOSIS — L408 Other psoriasis: Secondary | ICD-10-CM | POA: Diagnosis not present

## 2013-02-03 DIAGNOSIS — L900 Lichen sclerosus et atrophicus: Secondary | ICD-10-CM

## 2013-02-03 DIAGNOSIS — M8580 Other specified disorders of bone density and structure, unspecified site: Secondary | ICD-10-CM

## 2013-02-03 DIAGNOSIS — L940 Localized scleroderma [morphea]: Secondary | ICD-10-CM | POA: Diagnosis not present

## 2013-02-03 MED ORDER — CLOBETASOL PROPIONATE 0.05 % EX CREA: TOPICAL_CREAM | CUTANEOUS | Status: DC

## 2013-02-03 MED ORDER — RALOXIFENE HCL 60 MG PO TABS: 60.0000 mg | ORAL_TABLET | Freq: Every day | ORAL | Status: DC

## 2013-02-03 NOTE — Progress Notes (Signed)
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 Patient had been taking Evista but ran out of the prescription and is currently not taking it.  Exam: Bartholin urethra Skene glands with atrophic changes The white plaques that were noted before before she started on a clobetasol is 90% gone. No vaginal lesions or abnormalities noted. Bimanual exam not done.  Assessment/plan: Patient with lichen sclerosus responding well to clobetasol 0.05%. I recommend the patient to apply it once a week and twice a week if she has any pruritus. Prescription refill for the Evista 60 mg daily was provided. Patient will followup next year for annual exam or when necessary.

## 2013-02-06 DIAGNOSIS — L408 Other psoriasis: Secondary | ICD-10-CM | POA: Diagnosis not present

## 2013-02-08 DIAGNOSIS — L408 Other psoriasis: Secondary | ICD-10-CM | POA: Diagnosis not present

## 2013-02-10 DIAGNOSIS — L408 Other psoriasis: Secondary | ICD-10-CM | POA: Diagnosis not present

## 2013-02-13 DIAGNOSIS — L408 Other psoriasis: Secondary | ICD-10-CM | POA: Diagnosis not present

## 2013-02-17 DIAGNOSIS — L408 Other psoriasis: Secondary | ICD-10-CM | POA: Diagnosis not present

## 2013-02-20 DIAGNOSIS — L408 Other psoriasis: Secondary | ICD-10-CM | POA: Diagnosis not present

## 2013-02-22 DIAGNOSIS — L408 Other psoriasis: Secondary | ICD-10-CM | POA: Diagnosis not present

## 2013-02-24 DIAGNOSIS — L408 Other psoriasis: Secondary | ICD-10-CM | POA: Diagnosis not present

## 2013-02-27 DIAGNOSIS — L408 Other psoriasis: Secondary | ICD-10-CM | POA: Diagnosis not present

## 2013-03-01 DIAGNOSIS — L408 Other psoriasis: Secondary | ICD-10-CM | POA: Diagnosis not present

## 2013-03-01 DIAGNOSIS — M545 Low back pain, unspecified: Secondary | ICD-10-CM | POA: Diagnosis not present

## 2013-03-01 DIAGNOSIS — R1084 Generalized abdominal pain: Secondary | ICD-10-CM | POA: Diagnosis not present

## 2013-03-03 DIAGNOSIS — L408 Other psoriasis: Secondary | ICD-10-CM | POA: Diagnosis not present

## 2013-03-06 DIAGNOSIS — L408 Other psoriasis: Secondary | ICD-10-CM | POA: Diagnosis not present

## 2013-03-08 DIAGNOSIS — L408 Other psoriasis: Secondary | ICD-10-CM | POA: Diagnosis not present

## 2013-03-10 DIAGNOSIS — L408 Other psoriasis: Secondary | ICD-10-CM | POA: Diagnosis not present

## 2013-03-13 DIAGNOSIS — L408 Other psoriasis: Secondary | ICD-10-CM | POA: Diagnosis not present

## 2013-03-15 DIAGNOSIS — L408 Other psoriasis: Secondary | ICD-10-CM | POA: Diagnosis not present

## 2013-03-17 DIAGNOSIS — L408 Other psoriasis: Secondary | ICD-10-CM | POA: Diagnosis not present

## 2013-03-22 DIAGNOSIS — L408 Other psoriasis: Secondary | ICD-10-CM | POA: Diagnosis not present

## 2013-03-24 DIAGNOSIS — L408 Other psoriasis: Secondary | ICD-10-CM | POA: Diagnosis not present

## 2013-03-27 DIAGNOSIS — L408 Other psoriasis: Secondary | ICD-10-CM | POA: Diagnosis not present

## 2013-03-29 DIAGNOSIS — L408 Other psoriasis: Secondary | ICD-10-CM | POA: Diagnosis not present

## 2013-03-31 DIAGNOSIS — L408 Other psoriasis: Secondary | ICD-10-CM | POA: Diagnosis not present

## 2013-04-03 DIAGNOSIS — L408 Other psoriasis: Secondary | ICD-10-CM | POA: Diagnosis not present

## 2013-04-05 DIAGNOSIS — L408 Other psoriasis: Secondary | ICD-10-CM | POA: Diagnosis not present

## 2013-04-10 DIAGNOSIS — L408 Other psoriasis: Secondary | ICD-10-CM | POA: Diagnosis not present

## 2013-04-14 DIAGNOSIS — L408 Other psoriasis: Secondary | ICD-10-CM | POA: Diagnosis not present

## 2013-04-17 DIAGNOSIS — L408 Other psoriasis: Secondary | ICD-10-CM | POA: Diagnosis not present

## 2013-04-19 DIAGNOSIS — L408 Other psoriasis: Secondary | ICD-10-CM | POA: Diagnosis not present

## 2013-04-21 DIAGNOSIS — L408 Other psoriasis: Secondary | ICD-10-CM | POA: Diagnosis not present

## 2013-04-24 DIAGNOSIS — L408 Other psoriasis: Secondary | ICD-10-CM | POA: Diagnosis not present

## 2013-04-26 DIAGNOSIS — L408 Other psoriasis: Secondary | ICD-10-CM | POA: Diagnosis not present

## 2013-04-28 DIAGNOSIS — L408 Other psoriasis: Secondary | ICD-10-CM | POA: Diagnosis not present

## 2013-05-01 DIAGNOSIS — L408 Other psoriasis: Secondary | ICD-10-CM | POA: Diagnosis not present

## 2013-05-05 DIAGNOSIS — L408 Other psoriasis: Secondary | ICD-10-CM | POA: Diagnosis not present

## 2013-05-08 DIAGNOSIS — L408 Other psoriasis: Secondary | ICD-10-CM | POA: Diagnosis not present

## 2013-05-12 DIAGNOSIS — L408 Other psoriasis: Secondary | ICD-10-CM | POA: Diagnosis not present

## 2013-05-16 DIAGNOSIS — L408 Other psoriasis: Secondary | ICD-10-CM | POA: Diagnosis not present

## 2013-05-19 DIAGNOSIS — L408 Other psoriasis: Secondary | ICD-10-CM | POA: Diagnosis not present

## 2013-05-22 DIAGNOSIS — L408 Other psoriasis: Secondary | ICD-10-CM | POA: Diagnosis not present

## 2013-05-31 DIAGNOSIS — L408 Other psoriasis: Secondary | ICD-10-CM | POA: Diagnosis not present

## 2013-06-02 DIAGNOSIS — L408 Other psoriasis: Secondary | ICD-10-CM | POA: Diagnosis not present

## 2013-06-05 DIAGNOSIS — L408 Other psoriasis: Secondary | ICD-10-CM | POA: Diagnosis not present

## 2013-06-09 DIAGNOSIS — L408 Other psoriasis: Secondary | ICD-10-CM | POA: Diagnosis not present

## 2013-06-12 DIAGNOSIS — L408 Other psoriasis: Secondary | ICD-10-CM | POA: Diagnosis not present

## 2013-06-16 DIAGNOSIS — L408 Other psoriasis: Secondary | ICD-10-CM | POA: Diagnosis not present

## 2013-06-19 DIAGNOSIS — L408 Other psoriasis: Secondary | ICD-10-CM | POA: Diagnosis not present

## 2013-06-23 DIAGNOSIS — L408 Other psoriasis: Secondary | ICD-10-CM | POA: Diagnosis not present

## 2013-06-26 DIAGNOSIS — L408 Other psoriasis: Secondary | ICD-10-CM | POA: Diagnosis not present

## 2013-06-30 DIAGNOSIS — L408 Other psoriasis: Secondary | ICD-10-CM | POA: Diagnosis not present

## 2013-07-03 DIAGNOSIS — L408 Other psoriasis: Secondary | ICD-10-CM | POA: Diagnosis not present

## 2013-07-07 DIAGNOSIS — L408 Other psoriasis: Secondary | ICD-10-CM | POA: Diagnosis not present

## 2013-07-10 DIAGNOSIS — L408 Other psoriasis: Secondary | ICD-10-CM | POA: Diagnosis not present

## 2013-07-14 DIAGNOSIS — L408 Other psoriasis: Secondary | ICD-10-CM | POA: Diagnosis not present

## 2013-07-24 DIAGNOSIS — L408 Other psoriasis: Secondary | ICD-10-CM | POA: Diagnosis not present

## 2013-07-28 DIAGNOSIS — L408 Other psoriasis: Secondary | ICD-10-CM | POA: Diagnosis not present

## 2013-08-04 DIAGNOSIS — L408 Other psoriasis: Secondary | ICD-10-CM | POA: Diagnosis not present

## 2013-08-07 DIAGNOSIS — L408 Other psoriasis: Secondary | ICD-10-CM | POA: Diagnosis not present

## 2013-08-14 DIAGNOSIS — L408 Other psoriasis: Secondary | ICD-10-CM | POA: Diagnosis not present

## 2013-08-18 DIAGNOSIS — L408 Other psoriasis: Secondary | ICD-10-CM | POA: Diagnosis not present

## 2013-08-21 DIAGNOSIS — L408 Other psoriasis: Secondary | ICD-10-CM | POA: Diagnosis not present

## 2013-08-28 DIAGNOSIS — L408 Other psoriasis: Secondary | ICD-10-CM | POA: Diagnosis not present

## 2013-09-01 DIAGNOSIS — L408 Other psoriasis: Secondary | ICD-10-CM | POA: Diagnosis not present

## 2013-09-04 DIAGNOSIS — L408 Other psoriasis: Secondary | ICD-10-CM | POA: Diagnosis not present

## 2013-09-08 DIAGNOSIS — L408 Other psoriasis: Secondary | ICD-10-CM | POA: Diagnosis not present

## 2013-09-11 DIAGNOSIS — L408 Other psoriasis: Secondary | ICD-10-CM | POA: Diagnosis not present

## 2013-09-15 DIAGNOSIS — L408 Other psoriasis: Secondary | ICD-10-CM | POA: Diagnosis not present

## 2013-09-19 DIAGNOSIS — L408 Other psoriasis: Secondary | ICD-10-CM | POA: Diagnosis not present

## 2013-09-22 DIAGNOSIS — L408 Other psoriasis: Secondary | ICD-10-CM | POA: Diagnosis not present

## 2013-09-25 DIAGNOSIS — L408 Other psoriasis: Secondary | ICD-10-CM | POA: Diagnosis not present

## 2013-09-29 DIAGNOSIS — L408 Other psoriasis: Secondary | ICD-10-CM | POA: Diagnosis not present

## 2013-10-02 DIAGNOSIS — L408 Other psoriasis: Secondary | ICD-10-CM | POA: Diagnosis not present

## 2013-10-04 DIAGNOSIS — R82998 Other abnormal findings in urine: Secondary | ICD-10-CM | POA: Diagnosis not present

## 2013-10-04 DIAGNOSIS — E538 Deficiency of other specified B group vitamins: Secondary | ICD-10-CM | POA: Diagnosis not present

## 2013-10-04 DIAGNOSIS — M545 Low back pain, unspecified: Secondary | ICD-10-CM | POA: Diagnosis not present

## 2013-10-04 DIAGNOSIS — R5383 Other fatigue: Secondary | ICD-10-CM | POA: Diagnosis not present

## 2013-10-04 DIAGNOSIS — R5381 Other malaise: Secondary | ICD-10-CM | POA: Diagnosis not present

## 2013-10-06 DIAGNOSIS — L408 Other psoriasis: Secondary | ICD-10-CM | POA: Diagnosis not present

## 2013-10-09 DIAGNOSIS — L408 Other psoriasis: Secondary | ICD-10-CM | POA: Diagnosis not present

## 2013-10-11 DIAGNOSIS — Z1231 Encounter for screening mammogram for malignant neoplasm of breast: Secondary | ICD-10-CM | POA: Diagnosis not present

## 2013-10-11 DIAGNOSIS — Z803 Family history of malignant neoplasm of breast: Secondary | ICD-10-CM | POA: Diagnosis not present

## 2013-10-12 ENCOUNTER — Encounter: Payer: Self-pay | Admitting: Gynecology

## 2013-10-23 DIAGNOSIS — N390 Urinary tract infection, site not specified: Secondary | ICD-10-CM | POA: Diagnosis not present

## 2013-10-23 DIAGNOSIS — L408 Other psoriasis: Secondary | ICD-10-CM | POA: Diagnosis not present

## 2013-10-23 DIAGNOSIS — E538 Deficiency of other specified B group vitamins: Secondary | ICD-10-CM | POA: Diagnosis not present

## 2013-10-25 DIAGNOSIS — L408 Other psoriasis: Secondary | ICD-10-CM | POA: Diagnosis not present

## 2013-10-27 DIAGNOSIS — L408 Other psoriasis: Secondary | ICD-10-CM | POA: Diagnosis not present

## 2013-10-31 DIAGNOSIS — E538 Deficiency of other specified B group vitamins: Secondary | ICD-10-CM | POA: Diagnosis not present

## 2013-11-03 DIAGNOSIS — L408 Other psoriasis: Secondary | ICD-10-CM | POA: Diagnosis not present

## 2013-11-06 DIAGNOSIS — L408 Other psoriasis: Secondary | ICD-10-CM | POA: Diagnosis not present

## 2013-11-07 DIAGNOSIS — N390 Urinary tract infection, site not specified: Secondary | ICD-10-CM | POA: Diagnosis not present

## 2013-11-07 DIAGNOSIS — E538 Deficiency of other specified B group vitamins: Secondary | ICD-10-CM | POA: Diagnosis not present

## 2013-11-10 DIAGNOSIS — L408 Other psoriasis: Secondary | ICD-10-CM | POA: Diagnosis not present

## 2013-11-13 ENCOUNTER — Other Ambulatory Visit (HOSPITAL_COMMUNITY)
Admission: RE | Admit: 2013-11-13 | Discharge: 2013-11-13 | Disposition: A | Discharge status: Home / Self Care | Payer: Medicare Other | Source: Ambulatory Visit | Attending: Gynecology | Admitting: Gynecology

## 2013-11-13 ENCOUNTER — Ambulatory Visit (INDEPENDENT_AMBULATORY_CARE_PROVIDER_SITE_OTHER): Payer: Medicare Other | Admitting: Gynecology

## 2013-11-13 ENCOUNTER — Encounter: Payer: Self-pay | Admitting: Gynecology

## 2013-11-13 VITALS — BP 122/78 | Ht 63.0 in | Wt 206.0 lb

## 2013-11-13 DIAGNOSIS — M8580 Other specified disorders of bone density and structure, unspecified site: Secondary | ICD-10-CM

## 2013-11-13 DIAGNOSIS — M899 Disorder of bone, unspecified: Secondary | ICD-10-CM

## 2013-11-13 DIAGNOSIS — L293 Anogenital pruritus, unspecified: Secondary | ICD-10-CM

## 2013-11-13 DIAGNOSIS — N898 Other specified noninflammatory disorders of vagina: Secondary | ICD-10-CM

## 2013-11-13 DIAGNOSIS — L900 Lichen sclerosus et atrophicus: Secondary | ICD-10-CM

## 2013-11-13 DIAGNOSIS — M949 Disorder of cartilage, unspecified: Secondary | ICD-10-CM | POA: Diagnosis not present

## 2013-11-13 DIAGNOSIS — L408 Other psoriasis: Secondary | ICD-10-CM | POA: Diagnosis not present

## 2013-11-13 DIAGNOSIS — Z1151 Encounter for screening for human papillomavirus (HPV): Secondary | ICD-10-CM | POA: Diagnosis not present

## 2013-11-13 DIAGNOSIS — L940 Localized scleroderma [morphea]: Secondary | ICD-10-CM

## 2013-11-13 DIAGNOSIS — L409 Psoriasis, unspecified: Secondary | ICD-10-CM

## 2013-11-13 DIAGNOSIS — Z124 Encounter for screening for malignant neoplasm of cervix: Secondary | ICD-10-CM

## 2013-11-13 DIAGNOSIS — Z853 Personal history of malignant neoplasm of breast: Secondary | ICD-10-CM

## 2013-11-13 NOTE — Addendum Note (Signed)
Addended by: Thurnell Garbe A on: 11/13/2013 11:24 AM   Modules accepted: Orders

## 2013-11-13 NOTE — Patient Instructions (Addendum)
Shingles Vaccine What You Need to Know WHAT IS SHINGLES?  Shingles is a painful skin rash, often with blisters. It is also called Herpes Zoster or just Zoster.  A shingles rash usually appears on one side of the face or body and lasts from 2 to 4 weeks. Its main symptom is pain, which can be quite severe. Other symptoms of shingles can include fever, headache, chills, and upset stomach. Very rarely, a shingles infection can lead to pneumonia, hearing problems, blindness, brain inflammation (encephalitis), or death.  For about 1 person in 5, severe pain can continue even after the rash clears up. This is called post-herpetic neuralgia.  Shingles is caused by the Varicella Zoster virus. This is the same virus that causes chickenpox. Only someone who has had a case of chickenpox or rarely, has gotten chickenpox vaccine, can get shingles. The virus stays in your body. It can reappear many years later to cause a case of shingles.  You cannot catch shingles from another person with shingles. However, a person who has never had chickenpox (or chickenpox vaccine) could get chickenpox from someone with shingles. This is not very common.  Shingles is far more common in people 50 and older than in younger people. It is also more common in people whose immune systems are weakened because of a disease such as cancer or drugs such as steroids or chemotherapy.  At least 1 million people get shingles per year in the [place]. SHINGLES VACCINE  A vaccine for shingles was licensed in 2006. In clinical trials, the vaccine reduced the risk of shingles by 50%. It can also reduce the pain in people who still get shingles after being vaccinated.  A single dose of shingles vaccine is recommended for adults 60 years of age and older. SOME PEOPLE SHOULD NOT GET SHINGLES VACCINE OR SHOULD WAIT A person should not get shingles vaccine if he or she:  Has ever had a life-threatening allergic reaction to gelatin, the  antibiotic neomycin, or any other component of shingles vaccine. Tell your caregiver if you have any severe allergies.  Has a weakened immune system because of current:  AIDS or another disease that affects the immune system.  Treatment with drugs that affect the immune system, such as prolonged use of high-dose steroids.  Cancer treatment, such as radiation or chemotherapy.  Cancer affecting the bone marrow or lymphatic system, such as leukemia or lymphoma.  Is pregnant, or might be pregnant. Women should not become pregnant until at least 4 weeks after getting shingles vaccine. Someone with a minor illness, such as a cold, may be vaccinated. Anyone with a moderate or severe acute illness should usually wait until he or she recovers before getting the vaccine. This includes anyone with a temperature of 101.3 F (38 C) or higher. WHAT ARE THE RISKS FROM SHINGLES VACCINE?  A vaccine, like any medicine, could possibly cause serious problems, such as severe allergic reactions. However, the risk of a vaccine causing serious harm, or death, is extremely small.  No serious problems have been identified with shingles vaccine. Mild Problems  Redness, soreness, swelling, or itching at the site of the injection (about 1 person in 3).  Headache (about 1 person in 70). Like all vaccines, shingles vaccine is being closely monitored for unusual or severe problems. WHAT IF THERE IS A MODERATE OR SEVERE REACTION? What should I look for? Any unusual condition, such as a severe allergic reaction or a high fever. If a severe allergic reaction   occurred, it would be within a few minutes to an hour after the shot. Signs of a serious allergic reaction can include difficulty breathing, weakness, hoarseness or wheezing, a fast heartbeat, hives, dizziness, paleness, or swelling of the throat. What should I do?  Call your caregiver, or get the person to a caregiver right away.  Tell the caregiver what  happened, the date and time it happened, and when the vaccination was given.  Ask the caregiver to report the reaction by filing a Vaccine Adverse Event Reporting System (VAERS) form. Or, you can file this report through the VAERS web site at www.vaers.hhs.gov or by calling 1-800-822-7967. VAERS does not provide medical advice. HOW CAN I LEARN MORE?  Ask your caregiver. He or she can give you the vaccine package insert or suggest other sources of information.  Contact the Centers for Disease Control and Prevention (CDC):  Call 1-800-232-4636 (1-800-CDC-INFO).  Visit the CDC website at www.cdc.gov/vaccines CDC Shingles Vaccine VIS (11/29/07) Document Released: 12/07/2005 Document Revised: 05/04/2011 Document Reviewed: 06/01/2012 ExitCare Patient Information 2015 [employer]. This information is not intended to replace advice given to you by your health care provider. Make sure you discuss any questions you have with your health care provider. Bone Densitometry Bone densitometry is a special X-ray that measures your bone density and can be used to help predict your risk of bone fractures. This test is used to determine bone mineral content and density to diagnose osteoporosis. Osteoporosis is the loss of bone that may cause the bone to become weak. Osteoporosis commonly occurs in women entering menopause. However, it may be found in men and in people with other diseases. PREPARATION FOR TEST No preparation necessary. WHO SHOULD BE TESTED?  All women older than 65.  Postmenopausal women (50 to 65) with risk factors for osteoporosis.  People with a previous fracture caused by normal activities.  People with a small body frame (less than 127 poundsor a body mass index [BMI] of less than 21).  People who have a parent with a hip fracture or history of osteoporosis.  People who smoke.  People who have rheumatoid arthritis.  Anyone who engages in excessive alcohol use (more than 3  drinks most days).  Women who experience early menopause. WHEN SHOULD YOU BE RETESTED? Current guidelines suggest that you should wait at least 2 years before doing a bone density test again if your first test was normal.Recent studies indicated that women with normal bone density may be able to wait a few years before needing to repeat a bone density test. You should discuss this with your caregiver.  NORMAL FINDINGS   Normal: less than standard deviation below normal (greater than -1).  Osteopenia: 1 to 2.5 standard deviations below normal (-1 to -2.5).  Osteoporosis: greater than 2.5 standard deviations below normal (less than -2.5). Test results are reported as a "T score" and a "Z score."The T score is a number that compares your bone density with the bone density of healthy, young women.The Z score is a number that compares your bone density with the scores of women who are the same age, gender, and race.  Ranges for normal findings may vary among different laboratories and hospitals. You should always check with your doctor after having lab work or other tests done to discuss the meaning of your test results and whether your values are considered within normal limits. MEANING OF TEST  Your caregiver will go over the test results with you and discuss the importance   and meaning of your results, as well as treatment options and the need for additional tests if necessary. OBTAINING THE TEST RESULTS It is your responsibility to obtain your test results. Ask the lab or department performing the test when and how you will get your results. Document Released: 03/03/2004 Document Revised: 05/04/2011 Document Reviewed: 03/26/2010 ExitCare Patient Information 2015 [employer]. This information is not intended to replace advice given to you by your health care provider. Make sure you discuss any questions you have with your health care provider.  

## 2013-11-13 NOTE — Progress Notes (Signed)
Sharon Guerra March 18, 1945 [id number]   History:    68 y.o.  for GYN followup exam. On August 18 of 2014 patient had a right labia majora biopsy which demonstrated the following:  Diagnosis  Vulva, biopsy, right labia majora  - LICHEN SCLEROSIS.  - NO DYSPLASIA OR MALIGNANCY IDENTIFIED.  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 Patient had been taking Evista..She is having no pelvic pain. 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 She has had normal Pap smears since then. Her last Pap smear was 2012. She was doing bone densities at [facility] radiology/ patient's most recent bone density study done here in our office in 2013 demonstrated stable bone mineralization with the lowest T score of the AP spine and -2.1. Her FRAX analysis was normal. She has been followed by a dermatologist for her psoriasis .Patient had a normal colonoscopy in 2006. Patient has not received his shingles vaccine but received a Tdap vaccine in 2014.   The patient was complaining of vulvar irritation.   Past medical history,surgical history, family history and social history were all reviewed and documented in the EPIC chart.  Gynecologic History No LMP recorded. Patient is postmenopausal. Contraception: post menopausal status Last Pap: 2011. Results were: normal Last mammogram: 2015. Results were: normal  Obstetric History OB History  Gravida Para Term Preterm AB SAB TAB Ectopic Multiple Living  2 2 2       2     # Outcome Date GA Lbr Len/2nd Weight Sex Delivery Anes PTL Lv  2 TRM           1 TRM                ROS: A ROS was performed and pertinent positives and negatives are included in the  history.  GENERAL: No fevers or chills. HEENT: No change in vision, no earache, sore throat or sinus congestion. NECK: No pain or stiffness. CARDIOVASCULAR: No chest pain or pressure. No palpitations. PULMONARY: No shortness of breath, cough or wheeze. GASTROINTESTINAL: No abdominal pain, nausea, vomiting or diarrhea, melena or bright red blood per rectum. GENITOURINARY: No urinary frequency, urgency, hesitancy or dysuria. MUSCULOSKELETAL: No joint or muscle pain, no back pain, no recent trauma. DERMATOLOGIC: No rash, no itching, no lesions. ENDOCRINE: No polyuria, polydipsia, no heat or cold intolerance. No recent change in weight. HEMATOLOGICAL: No anemia or easy bruising or bleeding. NEUROLOGIC: No headache, seizures, numbness, tingling or weakness. PSYCHIATRIC: No depression, no loss of interest in normal activity or change in sleep pattern.     Exam: chaperone present  BP 122/78  Ht 5\' 3"  (1.6 m)  Wt 206 lb (93.441 kg)  BMI 36.50 kg/m2  Body mass index is 36.5 kg/(m^2).  General appearance : Well developed well nourished female. No acute distress HEENT: Neck supple, trachea midline, no carotid bruits, no thyroidmegaly Lungs: Clear to auscultation, no rhonchi or wheezes, or rib retractions  Heart: Regular rate and rhythm, no murmurs or gallops Breast:Examined in sitting and supine position were symmetrical in appearance, no palpable masses or tenderness,  no skin retraction, no nipple inversion, no nipple discharge, no skin discoloration, no axillary or supraclavicular lymphadenopathy Abdomen: no palpable masses or  tenderness, no rebound or guarding Extremities: no edema or skin discoloration or tenderness  Pelvic: Vulvar excoriated and pale areas were noted consistent with lichen sclerosus  Bartholin, Urethra, Skene Glands: Within normal limits             Vagina: No gross lesions or discharge  Cervix: No gross lesions or discharge  Uterus  axial, normal size, shape and consistency,  non-tender and mobile  Adnexa  Without masses or tenderness  Anus and perineum  normal   Rectovaginal  normal sphincter tone without palpated masses or tenderness             Hemoccult PCP provides     Assessment/Plan:  68 y.o. female for annual exam recurrence of lichen sclerosus. Patient with poor compliance. Patient has prescription for clobetasol she was instructed to apply twice a day for the next 2 weeks and then thereafter once weekly. I would like to see her in one month for followup. Patient will be scheduled her shingles vaccine as well as her bone density study. Pap smear was done today. Patient was monitored on the importance of calcium and vitamin D in regular exercise for osteoporosis prevention. We described and once a month but does self breast examination. Patient declined the flu vaccine today. Dr. Alroy Dust her PCP will be drawn her blood work.  Note: This dictation was prepared with  Dragon/digital dictation along withSmart phrase technology. Any transcriptional errors that result from this process are unintentional.   Terrance Mass MD, 10:23 AM 11/13/2013

## 2013-11-14 LAB — CYTOLOGY - PAP

## 2013-11-17 DIAGNOSIS — L408 Other psoriasis: Secondary | ICD-10-CM | POA: Diagnosis not present

## 2013-11-20 DIAGNOSIS — E538 Deficiency of other specified B group vitamins: Secondary | ICD-10-CM | POA: Diagnosis not present

## 2013-11-20 DIAGNOSIS — L408 Other psoriasis: Secondary | ICD-10-CM | POA: Diagnosis not present

## 2013-11-20 DIAGNOSIS — N390 Urinary tract infection, site not specified: Secondary | ICD-10-CM | POA: Diagnosis not present

## 2013-11-24 DIAGNOSIS — L400 Psoriasis vulgaris: Secondary | ICD-10-CM | POA: Diagnosis not present

## 2013-11-27 DIAGNOSIS — L400 Psoriasis vulgaris: Secondary | ICD-10-CM | POA: Diagnosis not present

## 2013-12-01 DIAGNOSIS — L400 Psoriasis vulgaris: Secondary | ICD-10-CM | POA: Diagnosis not present

## 2013-12-04 DIAGNOSIS — L400 Psoriasis vulgaris: Secondary | ICD-10-CM | POA: Diagnosis not present

## 2013-12-08 DIAGNOSIS — L400 Psoriasis vulgaris: Secondary | ICD-10-CM | POA: Diagnosis not present

## 2013-12-11 DIAGNOSIS — L400 Psoriasis vulgaris: Secondary | ICD-10-CM | POA: Diagnosis not present

## 2013-12-12 ENCOUNTER — Ambulatory Visit (INDEPENDENT_AMBULATORY_CARE_PROVIDER_SITE_OTHER): Payer: Medicare Other | Admitting: Gynecology

## 2013-12-12 ENCOUNTER — Encounter: Payer: Self-pay | Admitting: Gynecology

## 2013-12-12 ENCOUNTER — Ambulatory Visit (INDEPENDENT_AMBULATORY_CARE_PROVIDER_SITE_OTHER): Payer: Medicare Other

## 2013-12-12 VITALS — BP 130/78

## 2013-12-12 DIAGNOSIS — N9089 Other specified noninflammatory disorders of vulva and perineum: Secondary | ICD-10-CM | POA: Diagnosis not present

## 2013-12-12 DIAGNOSIS — M8580 Other specified disorders of bone density and structure, unspecified site: Secondary | ICD-10-CM

## 2013-12-12 DIAGNOSIS — M8588 Other specified disorders of bone density and structure, other site: Secondary | ICD-10-CM | POA: Diagnosis not present

## 2013-12-12 DIAGNOSIS — L900 Lichen sclerosus et atrophicus: Secondary | ICD-10-CM | POA: Diagnosis not present

## 2013-12-12 DIAGNOSIS — L292 Pruritus vulvae: Secondary | ICD-10-CM

## 2013-12-12 DIAGNOSIS — Z853 Personal history of malignant neoplasm of breast: Secondary | ICD-10-CM

## 2013-12-12 MED ORDER — RALOXIFENE HCL 60 MG PO TABS: 60.0000 mg | ORAL_TABLET | Freq: Every day | ORAL | Status: AC

## 2013-12-12 MED ORDER — PIMECROLIMUS 1 % EX CREA: TOPICAL_CREAM | Freq: Two times a day (BID) | CUTANEOUS | Status: DC

## 2013-12-12 NOTE — Progress Notes (Signed)
   Patient presented to the office to discuss her persistent vulvar pruritus as well as her bone density study that was done today. Patient is working at all or biopsy with the following result:  Diagnosis Vulva, biopsy, right labia majora - LICHEN SCLEROSIS. - NO DYSPLASIA OR MALIGNANCY IDENTIFIED.  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 Patient had been taking Evista until last year but she did not pick up her refill.She was doing bone densities at [facility] radiology/ patient's most recent bone density study done here in our office in 2013 demonstrated stable bone mineralization with the lowest T score of the AP spine and -2.1. Her FRAX analysis was normal.  Her bone density today demonstrated that her lowest T score was the AP spine and -2.2 and 1 study was compared 2013 there was no statistically significant decrease in bone mineralization. Her FRAX analysis again was normal.   Pelvic exam: Vulvar pruritus as well as pale areas consistent with lichen sclerosus  Assessment/plan: Lichen sclerosus. Patient restarted clobetasol treatment on September 21 has had minimal resolution of her symptoms. We're going to prescribe Elidel 1 % Cream to apply twice a day and asked her to return to the office in 6 weeks for followup. We are also going to prescribe her once again the Evista 60 mg daily because her past history of breast cancer and osteopenia. Resident and pros and cons of medication were discussed. Patient declined flu vaccine.

## 2013-12-14 ENCOUNTER — Ambulatory Visit: Payer: Medicare Other | Admitting: Gynecology

## 2013-12-14 DIAGNOSIS — E538 Deficiency of other specified B group vitamins: Secondary | ICD-10-CM | POA: Diagnosis not present

## 2013-12-15 DIAGNOSIS — L400 Psoriasis vulgaris: Secondary | ICD-10-CM | POA: Diagnosis not present

## 2013-12-18 DIAGNOSIS — L400 Psoriasis vulgaris: Secondary | ICD-10-CM | POA: Diagnosis not present

## 2013-12-22 DIAGNOSIS — L400 Psoriasis vulgaris: Secondary | ICD-10-CM | POA: Diagnosis not present

## 2013-12-25 ENCOUNTER — Encounter: Payer: Self-pay | Admitting: Gynecology

## 2013-12-28 DIAGNOSIS — D81818 Other biotin-dependent carboxylase deficiency: Secondary | ICD-10-CM | POA: Diagnosis not present

## 2013-12-29 DIAGNOSIS — L400 Psoriasis vulgaris: Secondary | ICD-10-CM | POA: Diagnosis not present

## 2014-01-01 DIAGNOSIS — L400 Psoriasis vulgaris: Secondary | ICD-10-CM | POA: Diagnosis not present

## 2014-01-05 DIAGNOSIS — L400 Psoriasis vulgaris: Secondary | ICD-10-CM | POA: Diagnosis not present

## 2014-01-12 DIAGNOSIS — L400 Psoriasis vulgaris: Secondary | ICD-10-CM | POA: Diagnosis not present

## 2014-01-15 DIAGNOSIS — L400 Psoriasis vulgaris: Secondary | ICD-10-CM | POA: Diagnosis not present

## 2014-01-15 DIAGNOSIS — E538 Deficiency of other specified B group vitamins: Secondary | ICD-10-CM | POA: Diagnosis not present

## 2014-01-23 ENCOUNTER — Ambulatory Visit (INDEPENDENT_AMBULATORY_CARE_PROVIDER_SITE_OTHER): Payer: Medicare Other | Admitting: Gynecology

## 2014-01-23 ENCOUNTER — Encounter: Payer: Self-pay | Admitting: Gynecology

## 2014-01-23 VITALS — BP 136/88

## 2014-01-23 DIAGNOSIS — M8580 Other specified disorders of bone density and structure, unspecified site: Secondary | ICD-10-CM

## 2014-01-23 DIAGNOSIS — L900 Lichen sclerosus et atrophicus: Secondary | ICD-10-CM

## 2014-01-23 NOTE — Progress Notes (Signed)
   Patient was diagnosed in 4970 with lichen sclerosus of the vulva. She had been prescribed clobetasol 0.05%) issues in the past with compliance. 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 Exam:   Bartholin urethra Skene glands within normal limits external genitalia area of the lichen sclerosus has improved by 90%. No other lesions were noted. Vaginal atrophy was evident.  Assessment/plan: #1 lichen sclerosus no dysplasia or malignancy identified and pathology report in 2014. Compliance issue with patient on using the clobetasol. Recently she used it twice a day for 2 weeks and then was using it only sparingly when she had pruritus. I want her to start it again twice a day for 2 weeks and then use it twice a week thereafter. If she does not have pruritus. We stressed the importance of adhering to recommendations. #2 osteopenia and with history of breast cancer we discussed importance of compliance with the Evista 60 mg daily. We'll follow-up next year time of her annual exam which she will be due for her next bone density study.

## 2014-01-26 DIAGNOSIS — L400 Psoriasis vulgaris: Secondary | ICD-10-CM | POA: Diagnosis not present

## 2014-01-29 DIAGNOSIS — L400 Psoriasis vulgaris: Secondary | ICD-10-CM | POA: Diagnosis not present

## 2014-02-02 DIAGNOSIS — L400 Psoriasis vulgaris: Secondary | ICD-10-CM | POA: Diagnosis not present

## 2014-02-09 DIAGNOSIS — L400 Psoriasis vulgaris: Secondary | ICD-10-CM | POA: Diagnosis not present

## 2014-02-12 DIAGNOSIS — E538 Deficiency of other specified B group vitamins: Secondary | ICD-10-CM | POA: Diagnosis not present

## 2014-02-12 DIAGNOSIS — L400 Psoriasis vulgaris: Secondary | ICD-10-CM | POA: Diagnosis not present

## 2014-02-19 DIAGNOSIS — L400 Psoriasis vulgaris: Secondary | ICD-10-CM | POA: Diagnosis not present

## 2014-02-26 DIAGNOSIS — L400 Psoriasis vulgaris: Secondary | ICD-10-CM | POA: Diagnosis not present

## 2014-03-02 DIAGNOSIS — L400 Psoriasis vulgaris: Secondary | ICD-10-CM | POA: Diagnosis not present

## 2014-03-05 DIAGNOSIS — L400 Psoriasis vulgaris: Secondary | ICD-10-CM | POA: Diagnosis not present

## 2014-03-09 DIAGNOSIS — L400 Psoriasis vulgaris: Secondary | ICD-10-CM | POA: Diagnosis not present

## 2014-03-13 DIAGNOSIS — L400 Psoriasis vulgaris: Secondary | ICD-10-CM | POA: Diagnosis not present

## 2014-03-14 DIAGNOSIS — E538 Deficiency of other specified B group vitamins: Secondary | ICD-10-CM | POA: Diagnosis not present

## 2014-03-23 DIAGNOSIS — L400 Psoriasis vulgaris: Secondary | ICD-10-CM | POA: Diagnosis not present

## 2014-03-26 DIAGNOSIS — L400 Psoriasis vulgaris: Secondary | ICD-10-CM | POA: Diagnosis not present

## 2014-03-30 DIAGNOSIS — L400 Psoriasis vulgaris: Secondary | ICD-10-CM | POA: Diagnosis not present

## 2014-04-02 DIAGNOSIS — L400 Psoriasis vulgaris: Secondary | ICD-10-CM | POA: Diagnosis not present

## 2014-04-06 DIAGNOSIS — L400 Psoriasis vulgaris: Secondary | ICD-10-CM | POA: Diagnosis not present

## 2014-04-10 DIAGNOSIS — L400 Psoriasis vulgaris: Secondary | ICD-10-CM | POA: Diagnosis not present

## 2014-04-11 DIAGNOSIS — D518 Other vitamin B12 deficiency anemias: Secondary | ICD-10-CM | POA: Diagnosis not present

## 2014-04-13 DIAGNOSIS — L400 Psoriasis vulgaris: Secondary | ICD-10-CM | POA: Diagnosis not present

## 2014-04-16 DIAGNOSIS — L400 Psoriasis vulgaris: Secondary | ICD-10-CM | POA: Diagnosis not present

## 2014-04-20 DIAGNOSIS — L400 Psoriasis vulgaris: Secondary | ICD-10-CM | POA: Diagnosis not present

## 2014-04-23 DIAGNOSIS — L400 Psoriasis vulgaris: Secondary | ICD-10-CM | POA: Diagnosis not present

## 2014-04-25 DIAGNOSIS — L400 Psoriasis vulgaris: Secondary | ICD-10-CM | POA: Diagnosis not present

## 2014-04-27 DIAGNOSIS — L400 Psoriasis vulgaris: Secondary | ICD-10-CM | POA: Diagnosis not present

## 2014-05-01 DIAGNOSIS — L400 Psoriasis vulgaris: Secondary | ICD-10-CM | POA: Diagnosis not present

## 2014-05-02 DIAGNOSIS — E538 Deficiency of other specified B group vitamins: Secondary | ICD-10-CM | POA: Diagnosis not present

## 2014-05-04 DIAGNOSIS — L400 Psoriasis vulgaris: Secondary | ICD-10-CM | POA: Diagnosis not present

## 2014-05-08 DIAGNOSIS — L400 Psoriasis vulgaris: Secondary | ICD-10-CM | POA: Diagnosis not present

## 2014-05-11 DIAGNOSIS — L400 Psoriasis vulgaris: Secondary | ICD-10-CM | POA: Diagnosis not present

## 2014-05-15 DIAGNOSIS — L400 Psoriasis vulgaris: Secondary | ICD-10-CM | POA: Diagnosis not present

## 2014-05-22 DIAGNOSIS — L400 Psoriasis vulgaris: Secondary | ICD-10-CM | POA: Diagnosis not present

## 2014-05-25 DIAGNOSIS — L400 Psoriasis vulgaris: Secondary | ICD-10-CM | POA: Diagnosis not present

## 2014-05-30 DIAGNOSIS — H10022 Other mucopurulent conjunctivitis, left eye: Secondary | ICD-10-CM | POA: Diagnosis not present

## 2014-05-30 DIAGNOSIS — E538 Deficiency of other specified B group vitamins: Secondary | ICD-10-CM | POA: Diagnosis not present

## 2014-06-05 DIAGNOSIS — L400 Psoriasis vulgaris: Secondary | ICD-10-CM | POA: Diagnosis not present

## 2014-06-08 DIAGNOSIS — L400 Psoriasis vulgaris: Secondary | ICD-10-CM | POA: Diagnosis not present

## 2014-06-12 DIAGNOSIS — L400 Psoriasis vulgaris: Secondary | ICD-10-CM | POA: Diagnosis not present

## 2014-06-15 DIAGNOSIS — L400 Psoriasis vulgaris: Secondary | ICD-10-CM | POA: Diagnosis not present

## 2014-06-19 DIAGNOSIS — L400 Psoriasis vulgaris: Secondary | ICD-10-CM | POA: Diagnosis not present

## 2014-06-22 DIAGNOSIS — L400 Psoriasis vulgaris: Secondary | ICD-10-CM | POA: Diagnosis not present

## 2014-06-26 DIAGNOSIS — L400 Psoriasis vulgaris: Secondary | ICD-10-CM | POA: Diagnosis not present

## 2014-06-29 DIAGNOSIS — E538 Deficiency of other specified B group vitamins: Secondary | ICD-10-CM | POA: Diagnosis not present

## 2014-06-29 DIAGNOSIS — L400 Psoriasis vulgaris: Secondary | ICD-10-CM | POA: Diagnosis not present

## 2014-07-03 DIAGNOSIS — L400 Psoriasis vulgaris: Secondary | ICD-10-CM | POA: Diagnosis not present

## 2014-07-06 DIAGNOSIS — L400 Psoriasis vulgaris: Secondary | ICD-10-CM | POA: Diagnosis not present

## 2014-07-10 DIAGNOSIS — L400 Psoriasis vulgaris: Secondary | ICD-10-CM | POA: Diagnosis not present

## 2014-07-17 DIAGNOSIS — L400 Psoriasis vulgaris: Secondary | ICD-10-CM | POA: Diagnosis not present

## 2014-07-20 DIAGNOSIS — L400 Psoriasis vulgaris: Secondary | ICD-10-CM | POA: Diagnosis not present

## 2014-07-24 DIAGNOSIS — L400 Psoriasis vulgaris: Secondary | ICD-10-CM | POA: Diagnosis not present

## 2014-07-27 DIAGNOSIS — L400 Psoriasis vulgaris: Secondary | ICD-10-CM | POA: Diagnosis not present

## 2014-07-27 DIAGNOSIS — E538 Deficiency of other specified B group vitamins: Secondary | ICD-10-CM | POA: Diagnosis not present

## 2014-07-31 DIAGNOSIS — L400 Psoriasis vulgaris: Secondary | ICD-10-CM | POA: Diagnosis not present

## 2014-08-03 DIAGNOSIS — L400 Psoriasis vulgaris: Secondary | ICD-10-CM | POA: Diagnosis not present

## 2014-08-07 DIAGNOSIS — L400 Psoriasis vulgaris: Secondary | ICD-10-CM | POA: Diagnosis not present

## 2014-08-10 DIAGNOSIS — L400 Psoriasis vulgaris: Secondary | ICD-10-CM | POA: Diagnosis not present

## 2014-08-17 DIAGNOSIS — L400 Psoriasis vulgaris: Secondary | ICD-10-CM | POA: Diagnosis not present

## 2014-08-21 DIAGNOSIS — L400 Psoriasis vulgaris: Secondary | ICD-10-CM | POA: Diagnosis not present

## 2014-08-24 DIAGNOSIS — L400 Psoriasis vulgaris: Secondary | ICD-10-CM | POA: Diagnosis not present

## 2014-08-24 DIAGNOSIS — E538 Deficiency of other specified B group vitamins: Secondary | ICD-10-CM | POA: Diagnosis not present

## 2014-08-28 DIAGNOSIS — L400 Psoriasis vulgaris: Secondary | ICD-10-CM | POA: Diagnosis not present

## 2014-08-31 DIAGNOSIS — L400 Psoriasis vulgaris: Secondary | ICD-10-CM | POA: Diagnosis not present

## 2014-09-04 DIAGNOSIS — L400 Psoriasis vulgaris: Secondary | ICD-10-CM | POA: Diagnosis not present

## 2014-09-07 DIAGNOSIS — L400 Psoriasis vulgaris: Secondary | ICD-10-CM | POA: Diagnosis not present

## 2014-09-11 DIAGNOSIS — L400 Psoriasis vulgaris: Secondary | ICD-10-CM | POA: Diagnosis not present

## 2014-09-18 DIAGNOSIS — L400 Psoriasis vulgaris: Secondary | ICD-10-CM | POA: Diagnosis not present

## 2014-09-21 DIAGNOSIS — E538 Deficiency of other specified B group vitamins: Secondary | ICD-10-CM | POA: Diagnosis not present

## 2014-09-21 DIAGNOSIS — L400 Psoriasis vulgaris: Secondary | ICD-10-CM | POA: Diagnosis not present

## 2014-09-25 DIAGNOSIS — L400 Psoriasis vulgaris: Secondary | ICD-10-CM | POA: Diagnosis not present

## 2014-09-28 DIAGNOSIS — L400 Psoriasis vulgaris: Secondary | ICD-10-CM | POA: Diagnosis not present

## 2014-10-02 DIAGNOSIS — L400 Psoriasis vulgaris: Secondary | ICD-10-CM | POA: Diagnosis not present

## 2014-10-05 DIAGNOSIS — L400 Psoriasis vulgaris: Secondary | ICD-10-CM | POA: Diagnosis not present

## 2014-10-09 DIAGNOSIS — L400 Psoriasis vulgaris: Secondary | ICD-10-CM | POA: Diagnosis not present

## 2014-10-12 DIAGNOSIS — L400 Psoriasis vulgaris: Secondary | ICD-10-CM | POA: Diagnosis not present

## 2014-10-15 DIAGNOSIS — E538 Deficiency of other specified B group vitamins: Secondary | ICD-10-CM | POA: Diagnosis not present

## 2014-10-16 DIAGNOSIS — L400 Psoriasis vulgaris: Secondary | ICD-10-CM | POA: Diagnosis not present

## 2014-10-23 DIAGNOSIS — L400 Psoriasis vulgaris: Secondary | ICD-10-CM | POA: Diagnosis not present

## 2014-10-26 DIAGNOSIS — L400 Psoriasis vulgaris: Secondary | ICD-10-CM | POA: Diagnosis not present

## 2014-10-30 DIAGNOSIS — L400 Psoriasis vulgaris: Secondary | ICD-10-CM | POA: Diagnosis not present

## 2014-11-06 DIAGNOSIS — L400 Psoriasis vulgaris: Secondary | ICD-10-CM | POA: Diagnosis not present

## 2014-11-09 DIAGNOSIS — L400 Psoriasis vulgaris: Secondary | ICD-10-CM | POA: Diagnosis not present

## 2014-11-13 DIAGNOSIS — E538 Deficiency of other specified B group vitamins: Secondary | ICD-10-CM | POA: Diagnosis not present

## 2014-11-14 DIAGNOSIS — L400 Psoriasis vulgaris: Secondary | ICD-10-CM | POA: Diagnosis not present

## 2014-11-16 DIAGNOSIS — L400 Psoriasis vulgaris: Secondary | ICD-10-CM | POA: Diagnosis not present

## 2014-11-27 DIAGNOSIS — L400 Psoriasis vulgaris: Secondary | ICD-10-CM | POA: Diagnosis not present

## 2014-11-30 DIAGNOSIS — L400 Psoriasis vulgaris: Secondary | ICD-10-CM | POA: Diagnosis not present

## 2014-12-04 DIAGNOSIS — L400 Psoriasis vulgaris: Secondary | ICD-10-CM | POA: Diagnosis not present

## 2014-12-07 DIAGNOSIS — L400 Psoriasis vulgaris: Secondary | ICD-10-CM | POA: Diagnosis not present

## 2014-12-11 DIAGNOSIS — E538 Deficiency of other specified B group vitamins: Secondary | ICD-10-CM | POA: Diagnosis not present

## 2014-12-14 DIAGNOSIS — J069 Acute upper respiratory infection, unspecified: Secondary | ICD-10-CM | POA: Diagnosis not present

## 2014-12-24 DIAGNOSIS — L400 Psoriasis vulgaris: Secondary | ICD-10-CM | POA: Diagnosis not present

## 2014-12-27 DIAGNOSIS — Z1231 Encounter for screening mammogram for malignant neoplasm of breast: Secondary | ICD-10-CM | POA: Diagnosis not present

## 2014-12-31 ENCOUNTER — Encounter: Payer: Self-pay | Admitting: Gynecology

## 2015-01-04 DIAGNOSIS — L400 Psoriasis vulgaris: Secondary | ICD-10-CM | POA: Diagnosis not present

## 2015-01-08 DIAGNOSIS — L400 Psoriasis vulgaris: Secondary | ICD-10-CM | POA: Diagnosis not present

## 2015-01-11 DIAGNOSIS — L400 Psoriasis vulgaris: Secondary | ICD-10-CM | POA: Diagnosis not present

## 2015-01-14 DIAGNOSIS — L400 Psoriasis vulgaris: Secondary | ICD-10-CM | POA: Diagnosis not present

## 2015-01-15 DIAGNOSIS — E538 Deficiency of other specified B group vitamins: Secondary | ICD-10-CM | POA: Diagnosis not present

## 2015-01-16 DIAGNOSIS — L400 Psoriasis vulgaris: Secondary | ICD-10-CM | POA: Diagnosis not present

## 2015-01-22 DIAGNOSIS — L400 Psoriasis vulgaris: Secondary | ICD-10-CM | POA: Diagnosis not present

## 2015-01-25 DIAGNOSIS — L400 Psoriasis vulgaris: Secondary | ICD-10-CM | POA: Diagnosis not present

## 2015-01-29 DIAGNOSIS — L400 Psoriasis vulgaris: Secondary | ICD-10-CM | POA: Diagnosis not present

## 2015-02-01 DIAGNOSIS — L400 Psoriasis vulgaris: Secondary | ICD-10-CM | POA: Diagnosis not present

## 2015-02-05 DIAGNOSIS — L400 Psoriasis vulgaris: Secondary | ICD-10-CM | POA: Diagnosis not present

## 2015-02-08 DIAGNOSIS — L400 Psoriasis vulgaris: Secondary | ICD-10-CM | POA: Diagnosis not present

## 2015-02-11 DIAGNOSIS — L400 Psoriasis vulgaris: Secondary | ICD-10-CM | POA: Diagnosis not present

## 2015-02-12 DIAGNOSIS — E538 Deficiency of other specified B group vitamins: Secondary | ICD-10-CM | POA: Diagnosis not present

## 2015-02-15 DIAGNOSIS — L400 Psoriasis vulgaris: Secondary | ICD-10-CM | POA: Diagnosis not present

## 2015-02-19 DIAGNOSIS — L400 Psoriasis vulgaris: Secondary | ICD-10-CM | POA: Diagnosis not present

## 2015-02-22 DIAGNOSIS — L400 Psoriasis vulgaris: Secondary | ICD-10-CM | POA: Diagnosis not present

## 2015-02-26 DIAGNOSIS — L400 Psoriasis vulgaris: Secondary | ICD-10-CM | POA: Diagnosis not present

## 2015-02-28 DIAGNOSIS — L400 Psoriasis vulgaris: Secondary | ICD-10-CM | POA: Diagnosis not present

## 2015-03-08 DIAGNOSIS — L400 Psoriasis vulgaris: Secondary | ICD-10-CM | POA: Diagnosis not present

## 2015-03-12 DIAGNOSIS — L400 Psoriasis vulgaris: Secondary | ICD-10-CM | POA: Diagnosis not present

## 2015-03-15 ENCOUNTER — Encounter: Payer: Self-pay | Admitting: Gynecology

## 2015-03-15 ENCOUNTER — Ambulatory Visit (INDEPENDENT_AMBULATORY_CARE_PROVIDER_SITE_OTHER): Payer: Medicare Other | Admitting: Gynecology

## 2015-03-15 VITALS — BP 132/86 | Ht 62.5 in | Wt 191.0 lb

## 2015-03-15 DIAGNOSIS — N9089 Other specified noninflammatory disorders of vulva and perineum: Secondary | ICD-10-CM

## 2015-03-15 DIAGNOSIS — M8580 Other specified disorders of bone density and structure, unspecified site: Secondary | ICD-10-CM | POA: Diagnosis not present

## 2015-03-15 DIAGNOSIS — Z01419 Encounter for gynecological examination (general) (routine) without abnormal findings: Secondary | ICD-10-CM | POA: Diagnosis not present

## 2015-03-15 DIAGNOSIS — L900 Lichen sclerosus et atrophicus: Secondary | ICD-10-CM

## 2015-03-15 DIAGNOSIS — E538 Deficiency of other specified B group vitamins: Secondary | ICD-10-CM | POA: Diagnosis not present

## 2015-03-15 DIAGNOSIS — L400 Psoriasis vulgaris: Secondary | ICD-10-CM | POA: Diagnosis not present

## 2015-03-15 MED ORDER — CLOBETASOL PROPIONATE 0.05 % EX CREA: TOPICAL_CREAM | CUTANEOUS | Status: AC

## 2015-03-15 NOTE — Patient Instructions (Signed)

## 2015-03-15 NOTE — Progress Notes (Signed)
Sharon Guerra 1945-10-26 [id number]   History:    70 y.o.  for annual gyn exam who has not been seen the office in 2015. Patient with biopsy-proven lichen sclerosus of the vulva and had been prescribed clobetasol 0.05% which she has had poor compliance and would like a refill. Also patient with past history of breast cancer and history of osteopenia had been on Evista 60 mg every weekly and she has been off the medication. Patient is being followed by her dermatologist in being treated for psoriasis as well. 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 She has had normal Pap smears since then. Her last Pap smear was in 2015. Her last bone density in 2015 demonstrated the her lowest T score was -2.2 at the AP spine. Her right femoral neck was -1.1 T score and she had a normal Frax analysis. When compared with 2013 there was no significant change. Patient reports normal colonoscopy in 2006 has not had a follow-up. Patient declined flu vaccine. She is not followed up with her PCP Dr. Donnie Coffin.  Past medical history,surgical history, family history and social history were all reviewed and documented in the EPIC chart.  Gynecologic History No LMP recorded. Patient is postmenopausal. Contraception: post menopausal status Last Pap: 2015. Results were: normal Last mammogram: 2016. Results were: normal  Obstetric History OB History  Gravida Para Term Preterm AB SAB TAB Ectopic Multiple Living  2 2 2       2     # Outcome Date GA Lbr Len/2nd Weight Sex Delivery Anes PTL Lv  2 Term           1 Term                ROS: A ROS was performed and pertinent positives and negatives are included in the history.  GENERAL: No fevers or chills. HEENT: No change in vision, no earache, sore throat or sinus congestion. NECK: No pain or stiffness.  CARDIOVASCULAR: No chest pain or pressure. No palpitations. PULMONARY: No shortness of breath, cough or wheeze. GASTROINTESTINAL: No abdominal pain, nausea, vomiting or diarrhea, melena or bright red blood per rectum. GENITOURINARY: No urinary frequency, urgency, hesitancy or dysuria. MUSCULOSKELETAL: No joint or muscle pain, no back pain, no recent trauma. DERMATOLOGIC: No rash, no itching, no lesions. ENDOCRINE: No polyuria, polydipsia, no heat or cold intolerance. No recent change in weight. HEMATOLOGICAL: No anemia or easy bruising or bleeding. NEUROLOGIC: No headache, seizures, numbness, tingling or weakness. PSYCHIATRIC: No depression, no loss of interest in normal activity or change in sleep pattern.     Exam: chaperone present  BP 132/86 mmHg  Ht 5' 2.5" (1.588 m)  Wt 191 lb (86.637 kg)  BMI 34.36 kg/m2  Body mass index is 34.36 kg/(m^2).  General appearance : Well developed well nourished female. No acute distress HEENT: Eyes: no retinal hemorrhage or exudates,  Neck supple, trachea midline, no carotid bruits, no thyroidmegaly Lungs: Clear to auscultation, no rhonchi or wheezes, or rib retractions  Heart: Regular rate and rhythm, no murmurs or gallops Breast:Examined in sitting and supine position were symmetrical in appearance, no palpable masses or tenderness,  no skin retraction, no nipple inversion, no nipple discharge, no skin discoloration, no axillary or supraclavicular lymphadenopathy Abdomen: no palpable masses or tenderness, no rebound or guarding Extremities: no edema or skin discoloration  or tenderness  Pelvic: Supple. A portion of both labia minora with evidence of lichen sclerosus  Bartholin, Urethra, Skene Glands: Within normal limits             Vagina: No gross lesions or discharge, atrophic changes  Cervix: No gross lesions or discharge  Uterus  anteverted, normal size, shape and consistency, non-tender and mobile  Adnexa  Without masses or tenderness  Anus and  perineum  normal   Rectovaginal  normal sphincter tone without palpated masses or tenderness             Hemoccult PCP provides     Assessment/Plan:  70 y.o. female for annual exam with lichen sclerosus was prescribed clobetasol 0.05% to apply twice a day for 10 days and then apply twice a week thereafter. Patient to schedule a bone density study here in the office and we'll see if she needs to restart the Evista which she is no longer taking. Patient also to contact her gastric neurologist to schedule colonoscopy. Patient to make appointment to see her PCP Dr. Alroy Dust for her blood work and medical exam as well. Pap smear not done today. We discussed importance of calcium vitamin D and weightbearing exercises for osteoporosis prevention.   Terrance Mass MD, 11:40 AM 03/15/2015

## 2015-03-19 DIAGNOSIS — L400 Psoriasis vulgaris: Secondary | ICD-10-CM | POA: Diagnosis not present

## 2015-03-22 DIAGNOSIS — L400 Psoriasis vulgaris: Secondary | ICD-10-CM | POA: Diagnosis not present

## 2015-03-26 DIAGNOSIS — L400 Psoriasis vulgaris: Secondary | ICD-10-CM | POA: Diagnosis not present

## 2015-03-29 DIAGNOSIS — L400 Psoriasis vulgaris: Secondary | ICD-10-CM | POA: Diagnosis not present

## 2015-04-02 ENCOUNTER — Other Ambulatory Visit: Payer: Self-pay | Admitting: Gynecology

## 2015-04-02 ENCOUNTER — Ambulatory Visit (INDEPENDENT_AMBULATORY_CARE_PROVIDER_SITE_OTHER): Payer: Medicare Other

## 2015-04-02 DIAGNOSIS — M899 Disorder of bone, unspecified: Secondary | ICD-10-CM

## 2015-04-02 DIAGNOSIS — M8580 Other specified disorders of bone density and structure, unspecified site: Secondary | ICD-10-CM

## 2015-04-02 DIAGNOSIS — L400 Psoriasis vulgaris: Secondary | ICD-10-CM | POA: Diagnosis not present

## 2015-04-05 DIAGNOSIS — L400 Psoriasis vulgaris: Secondary | ICD-10-CM | POA: Diagnosis not present

## 2015-04-08 DIAGNOSIS — L400 Psoriasis vulgaris: Secondary | ICD-10-CM | POA: Diagnosis not present

## 2015-04-12 DIAGNOSIS — L400 Psoriasis vulgaris: Secondary | ICD-10-CM | POA: Diagnosis not present

## 2015-04-16 DIAGNOSIS — L400 Psoriasis vulgaris: Secondary | ICD-10-CM | POA: Diagnosis not present

## 2015-04-19 DIAGNOSIS — L400 Psoriasis vulgaris: Secondary | ICD-10-CM | POA: Diagnosis not present

## 2015-04-22 DIAGNOSIS — Z0000 Encounter for general adult medical examination without abnormal findings: Secondary | ICD-10-CM | POA: Diagnosis not present

## 2015-04-22 DIAGNOSIS — Z1211 Encounter for screening for malignant neoplasm of colon: Secondary | ICD-10-CM | POA: Diagnosis not present

## 2015-04-22 DIAGNOSIS — E538 Deficiency of other specified B group vitamins: Secondary | ICD-10-CM | POA: Diagnosis not present

## 2015-04-22 DIAGNOSIS — Z23 Encounter for immunization: Secondary | ICD-10-CM | POA: Diagnosis not present

## 2015-04-22 DIAGNOSIS — E7800 Pure hypercholesterolemia, unspecified: Secondary | ICD-10-CM | POA: Diagnosis not present

## 2015-04-23 DIAGNOSIS — L400 Psoriasis vulgaris: Secondary | ICD-10-CM | POA: Diagnosis not present

## 2015-04-26 DIAGNOSIS — L400 Psoriasis vulgaris: Secondary | ICD-10-CM | POA: Diagnosis not present

## 2015-05-01 DIAGNOSIS — L400 Psoriasis vulgaris: Secondary | ICD-10-CM | POA: Diagnosis not present

## 2015-05-03 DIAGNOSIS — L400 Psoriasis vulgaris: Secondary | ICD-10-CM | POA: Diagnosis not present

## 2015-05-06 DIAGNOSIS — Z1211 Encounter for screening for malignant neoplasm of colon: Secondary | ICD-10-CM | POA: Diagnosis not present

## 2015-05-06 DIAGNOSIS — K5730 Diverticulosis of large intestine without perforation or abscess without bleeding: Secondary | ICD-10-CM | POA: Diagnosis not present

## 2015-05-07 DIAGNOSIS — L400 Psoriasis vulgaris: Secondary | ICD-10-CM | POA: Diagnosis not present

## 2015-05-10 DIAGNOSIS — L400 Psoriasis vulgaris: Secondary | ICD-10-CM | POA: Diagnosis not present

## 2015-05-14 DIAGNOSIS — L400 Psoriasis vulgaris: Secondary | ICD-10-CM | POA: Diagnosis not present

## 2015-05-17 DIAGNOSIS — L400 Psoriasis vulgaris: Secondary | ICD-10-CM | POA: Diagnosis not present

## 2015-05-20 ENCOUNTER — Encounter: Payer: Self-pay | Admitting: Gynecology

## 2015-05-20 DIAGNOSIS — E538 Deficiency of other specified B group vitamins: Secondary | ICD-10-CM | POA: Diagnosis not present

## 2015-05-29 DIAGNOSIS — L400 Psoriasis vulgaris: Secondary | ICD-10-CM | POA: Diagnosis not present

## 2015-05-31 DIAGNOSIS — L400 Psoriasis vulgaris: Secondary | ICD-10-CM | POA: Diagnosis not present

## 2015-06-04 DIAGNOSIS — L400 Psoriasis vulgaris: Secondary | ICD-10-CM | POA: Diagnosis not present

## 2015-06-11 DIAGNOSIS — L400 Psoriasis vulgaris: Secondary | ICD-10-CM | POA: Diagnosis not present

## 2015-06-14 DIAGNOSIS — L400 Psoriasis vulgaris: Secondary | ICD-10-CM | POA: Diagnosis not present

## 2015-06-17 DIAGNOSIS — E538 Deficiency of other specified B group vitamins: Secondary | ICD-10-CM | POA: Diagnosis not present

## 2015-06-18 DIAGNOSIS — L400 Psoriasis vulgaris: Secondary | ICD-10-CM | POA: Diagnosis not present

## 2015-06-20 DIAGNOSIS — L400 Psoriasis vulgaris: Secondary | ICD-10-CM | POA: Diagnosis not present

## 2015-06-25 DIAGNOSIS — L400 Psoriasis vulgaris: Secondary | ICD-10-CM | POA: Diagnosis not present

## 2015-06-28 DIAGNOSIS — L400 Psoriasis vulgaris: Secondary | ICD-10-CM | POA: Diagnosis not present

## 2015-07-02 DIAGNOSIS — L400 Psoriasis vulgaris: Secondary | ICD-10-CM | POA: Diagnosis not present

## 2015-07-05 DIAGNOSIS — L400 Psoriasis vulgaris: Secondary | ICD-10-CM | POA: Diagnosis not present

## 2015-07-09 DIAGNOSIS — L400 Psoriasis vulgaris: Secondary | ICD-10-CM | POA: Diagnosis not present

## 2015-07-15 DIAGNOSIS — E538 Deficiency of other specified B group vitamins: Secondary | ICD-10-CM | POA: Diagnosis not present

## 2015-07-16 DIAGNOSIS — L400 Psoriasis vulgaris: Secondary | ICD-10-CM | POA: Diagnosis not present

## 2015-07-19 DIAGNOSIS — L400 Psoriasis vulgaris: Secondary | ICD-10-CM | POA: Diagnosis not present

## 2015-07-23 DIAGNOSIS — L400 Psoriasis vulgaris: Secondary | ICD-10-CM | POA: Diagnosis not present

## 2015-07-26 DIAGNOSIS — L400 Psoriasis vulgaris: Secondary | ICD-10-CM | POA: Diagnosis not present

## 2015-07-30 DIAGNOSIS — L400 Psoriasis vulgaris: Secondary | ICD-10-CM | POA: Diagnosis not present

## 2015-08-06 DIAGNOSIS — L400 Psoriasis vulgaris: Secondary | ICD-10-CM | POA: Diagnosis not present

## 2015-08-09 DIAGNOSIS — L400 Psoriasis vulgaris: Secondary | ICD-10-CM | POA: Diagnosis not present

## 2015-08-13 DIAGNOSIS — E538 Deficiency of other specified B group vitamins: Secondary | ICD-10-CM | POA: Diagnosis not present

## 2015-08-13 DIAGNOSIS — L400 Psoriasis vulgaris: Secondary | ICD-10-CM | POA: Diagnosis not present

## 2015-08-16 DIAGNOSIS — L400 Psoriasis vulgaris: Secondary | ICD-10-CM | POA: Diagnosis not present

## 2015-08-20 DIAGNOSIS — L400 Psoriasis vulgaris: Secondary | ICD-10-CM | POA: Diagnosis not present

## 2015-08-23 DIAGNOSIS — L400 Psoriasis vulgaris: Secondary | ICD-10-CM | POA: Diagnosis not present

## 2015-08-30 DIAGNOSIS — L400 Psoriasis vulgaris: Secondary | ICD-10-CM | POA: Diagnosis not present

## 2015-09-03 DIAGNOSIS — L400 Psoriasis vulgaris: Secondary | ICD-10-CM | POA: Diagnosis not present

## 2015-09-10 DIAGNOSIS — E538 Deficiency of other specified B group vitamins: Secondary | ICD-10-CM | POA: Diagnosis not present

## 2015-09-10 DIAGNOSIS — L400 Psoriasis vulgaris: Secondary | ICD-10-CM | POA: Diagnosis not present

## 2015-09-13 DIAGNOSIS — L400 Psoriasis vulgaris: Secondary | ICD-10-CM | POA: Diagnosis not present

## 2015-09-17 DIAGNOSIS — L400 Psoriasis vulgaris: Secondary | ICD-10-CM | POA: Diagnosis not present

## 2015-09-20 DIAGNOSIS — L400 Psoriasis vulgaris: Secondary | ICD-10-CM | POA: Diagnosis not present

## 2015-09-24 DIAGNOSIS — L400 Psoriasis vulgaris: Secondary | ICD-10-CM | POA: Diagnosis not present

## 2015-09-27 DIAGNOSIS — L400 Psoriasis vulgaris: Secondary | ICD-10-CM | POA: Diagnosis not present

## 2015-10-01 DIAGNOSIS — L400 Psoriasis vulgaris: Secondary | ICD-10-CM | POA: Diagnosis not present

## 2015-10-04 DIAGNOSIS — L400 Psoriasis vulgaris: Secondary | ICD-10-CM | POA: Diagnosis not present

## 2015-10-08 DIAGNOSIS — L400 Psoriasis vulgaris: Secondary | ICD-10-CM | POA: Diagnosis not present

## 2015-10-11 DIAGNOSIS — E538 Deficiency of other specified B group vitamins: Secondary | ICD-10-CM | POA: Diagnosis not present

## 2015-10-11 DIAGNOSIS — L400 Psoriasis vulgaris: Secondary | ICD-10-CM | POA: Diagnosis not present

## 2015-10-18 DIAGNOSIS — L400 Psoriasis vulgaris: Secondary | ICD-10-CM | POA: Diagnosis not present

## 2015-10-22 DIAGNOSIS — L400 Psoriasis vulgaris: Secondary | ICD-10-CM | POA: Diagnosis not present

## 2015-10-25 DIAGNOSIS — L400 Psoriasis vulgaris: Secondary | ICD-10-CM | POA: Diagnosis not present

## 2015-10-29 DIAGNOSIS — L400 Psoriasis vulgaris: Secondary | ICD-10-CM | POA: Diagnosis not present

## 2015-11-01 DIAGNOSIS — L400 Psoriasis vulgaris: Secondary | ICD-10-CM | POA: Diagnosis not present

## 2015-11-05 DIAGNOSIS — L400 Psoriasis vulgaris: Secondary | ICD-10-CM | POA: Diagnosis not present

## 2015-11-08 DIAGNOSIS — L400 Psoriasis vulgaris: Secondary | ICD-10-CM | POA: Diagnosis not present

## 2015-11-12 DIAGNOSIS — L400 Psoriasis vulgaris: Secondary | ICD-10-CM | POA: Diagnosis not present

## 2015-11-15 DIAGNOSIS — E538 Deficiency of other specified B group vitamins: Secondary | ICD-10-CM | POA: Diagnosis not present

## 2015-11-15 DIAGNOSIS — L400 Psoriasis vulgaris: Secondary | ICD-10-CM | POA: Diagnosis not present

## 2015-11-19 DIAGNOSIS — L400 Psoriasis vulgaris: Secondary | ICD-10-CM | POA: Diagnosis not present

## 2015-11-22 DIAGNOSIS — L400 Psoriasis vulgaris: Secondary | ICD-10-CM | POA: Diagnosis not present

## 2015-11-26 DIAGNOSIS — L400 Psoriasis vulgaris: Secondary | ICD-10-CM | POA: Diagnosis not present

## 2015-12-03 DIAGNOSIS — L400 Psoriasis vulgaris: Secondary | ICD-10-CM | POA: Diagnosis not present

## 2015-12-06 DIAGNOSIS — L400 Psoriasis vulgaris: Secondary | ICD-10-CM | POA: Diagnosis not present

## 2015-12-10 DIAGNOSIS — L400 Psoriasis vulgaris: Secondary | ICD-10-CM | POA: Diagnosis not present

## 2015-12-13 DIAGNOSIS — L400 Psoriasis vulgaris: Secondary | ICD-10-CM | POA: Diagnosis not present

## 2015-12-13 DIAGNOSIS — E538 Deficiency of other specified B group vitamins: Secondary | ICD-10-CM | POA: Diagnosis not present

## 2015-12-17 DIAGNOSIS — L400 Psoriasis vulgaris: Secondary | ICD-10-CM | POA: Diagnosis not present

## 2015-12-20 DIAGNOSIS — L400 Psoriasis vulgaris: Secondary | ICD-10-CM | POA: Diagnosis not present

## 2015-12-24 DIAGNOSIS — L400 Psoriasis vulgaris: Secondary | ICD-10-CM | POA: Diagnosis not present

## 2015-12-27 DIAGNOSIS — L400 Psoriasis vulgaris: Secondary | ICD-10-CM | POA: Diagnosis not present

## 2015-12-31 DIAGNOSIS — L400 Psoriasis vulgaris: Secondary | ICD-10-CM | POA: Diagnosis not present

## 2016-01-03 DIAGNOSIS — L400 Psoriasis vulgaris: Secondary | ICD-10-CM | POA: Diagnosis not present

## 2016-01-07 DIAGNOSIS — L400 Psoriasis vulgaris: Secondary | ICD-10-CM | POA: Diagnosis not present

## 2016-01-10 DIAGNOSIS — E538 Deficiency of other specified B group vitamins: Secondary | ICD-10-CM | POA: Diagnosis not present

## 2016-01-10 DIAGNOSIS — L400 Psoriasis vulgaris: Secondary | ICD-10-CM | POA: Diagnosis not present

## 2016-01-14 DIAGNOSIS — L400 Psoriasis vulgaris: Secondary | ICD-10-CM | POA: Diagnosis not present

## 2016-01-21 DIAGNOSIS — L400 Psoriasis vulgaris: Secondary | ICD-10-CM | POA: Diagnosis not present

## 2016-01-24 DIAGNOSIS — L400 Psoriasis vulgaris: Secondary | ICD-10-CM | POA: Diagnosis not present

## 2016-01-28 DIAGNOSIS — L400 Psoriasis vulgaris: Secondary | ICD-10-CM | POA: Diagnosis not present

## 2016-01-31 DIAGNOSIS — L400 Psoriasis vulgaris: Secondary | ICD-10-CM | POA: Diagnosis not present

## 2016-02-04 DIAGNOSIS — L400 Psoriasis vulgaris: Secondary | ICD-10-CM | POA: Diagnosis not present

## 2016-02-11 DIAGNOSIS — L400 Psoriasis vulgaris: Secondary | ICD-10-CM | POA: Diagnosis not present

## 2016-02-11 DIAGNOSIS — E538 Deficiency of other specified B group vitamins: Secondary | ICD-10-CM | POA: Diagnosis not present

## 2016-02-14 DIAGNOSIS — L400 Psoriasis vulgaris: Secondary | ICD-10-CM | POA: Diagnosis not present

## 2016-02-18 DIAGNOSIS — L400 Psoriasis vulgaris: Secondary | ICD-10-CM | POA: Diagnosis not present

## 2016-02-21 DIAGNOSIS — L400 Psoriasis vulgaris: Secondary | ICD-10-CM | POA: Diagnosis not present

## 2016-02-25 DIAGNOSIS — L400 Psoriasis vulgaris: Secondary | ICD-10-CM | POA: Diagnosis not present

## 2016-02-28 DIAGNOSIS — L400 Psoriasis vulgaris: Secondary | ICD-10-CM | POA: Diagnosis not present

## 2016-03-04 ENCOUNTER — Encounter: Payer: Self-pay | Admitting: Gynecology

## 2016-03-04 DIAGNOSIS — Z1231 Encounter for screening mammogram for malignant neoplasm of breast: Secondary | ICD-10-CM | POA: Diagnosis not present

## 2016-03-04 DIAGNOSIS — Z853 Personal history of malignant neoplasm of breast: Secondary | ICD-10-CM | POA: Diagnosis not present

## 2016-03-06 DIAGNOSIS — L400 Psoriasis vulgaris: Secondary | ICD-10-CM | POA: Diagnosis not present

## 2016-03-10 DIAGNOSIS — E538 Deficiency of other specified B group vitamins: Secondary | ICD-10-CM | POA: Diagnosis not present

## 2016-03-10 DIAGNOSIS — L400 Psoriasis vulgaris: Secondary | ICD-10-CM | POA: Diagnosis not present

## 2016-03-17 DIAGNOSIS — L400 Psoriasis vulgaris: Secondary | ICD-10-CM | POA: Diagnosis not present

## 2016-03-20 DIAGNOSIS — L400 Psoriasis vulgaris: Secondary | ICD-10-CM | POA: Diagnosis not present

## 2016-03-24 DIAGNOSIS — L400 Psoriasis vulgaris: Secondary | ICD-10-CM | POA: Diagnosis not present

## 2016-03-27 DIAGNOSIS — L400 Psoriasis vulgaris: Secondary | ICD-10-CM | POA: Diagnosis not present

## 2016-03-31 DIAGNOSIS — L400 Psoriasis vulgaris: Secondary | ICD-10-CM | POA: Diagnosis not present

## 2016-04-03 DIAGNOSIS — L400 Psoriasis vulgaris: Secondary | ICD-10-CM | POA: Diagnosis not present

## 2016-04-10 DIAGNOSIS — L400 Psoriasis vulgaris: Secondary | ICD-10-CM | POA: Diagnosis not present

## 2016-04-10 DIAGNOSIS — E538 Deficiency of other specified B group vitamins: Secondary | ICD-10-CM | POA: Diagnosis not present

## 2016-04-14 DIAGNOSIS — L400 Psoriasis vulgaris: Secondary | ICD-10-CM | POA: Diagnosis not present

## 2016-04-17 DIAGNOSIS — L400 Psoriasis vulgaris: Secondary | ICD-10-CM | POA: Diagnosis not present

## 2016-04-21 DIAGNOSIS — L400 Psoriasis vulgaris: Secondary | ICD-10-CM | POA: Diagnosis not present

## 2016-04-22 DIAGNOSIS — L409 Psoriasis, unspecified: Secondary | ICD-10-CM | POA: Diagnosis not present

## 2016-04-22 DIAGNOSIS — Z23 Encounter for immunization: Secondary | ICD-10-CM | POA: Diagnosis not present

## 2016-04-22 DIAGNOSIS — E7800 Pure hypercholesterolemia, unspecified: Secondary | ICD-10-CM | POA: Diagnosis not present

## 2016-04-22 DIAGNOSIS — E538 Deficiency of other specified B group vitamins: Secondary | ICD-10-CM | POA: Diagnosis not present

## 2016-04-22 DIAGNOSIS — Z0000 Encounter for general adult medical examination without abnormal findings: Secondary | ICD-10-CM | POA: Diagnosis not present

## 2016-04-24 DIAGNOSIS — L400 Psoriasis vulgaris: Secondary | ICD-10-CM | POA: Diagnosis not present

## 2016-04-28 DIAGNOSIS — L400 Psoriasis vulgaris: Secondary | ICD-10-CM | POA: Diagnosis not present

## 2016-05-01 DIAGNOSIS — L400 Psoriasis vulgaris: Secondary | ICD-10-CM | POA: Diagnosis not present

## 2016-05-08 DIAGNOSIS — E538 Deficiency of other specified B group vitamins: Secondary | ICD-10-CM | POA: Diagnosis not present

## 2016-05-08 DIAGNOSIS — L400 Psoriasis vulgaris: Secondary | ICD-10-CM | POA: Diagnosis not present

## 2016-05-12 DIAGNOSIS — L400 Psoriasis vulgaris: Secondary | ICD-10-CM | POA: Diagnosis not present

## 2016-05-15 DIAGNOSIS — L400 Psoriasis vulgaris: Secondary | ICD-10-CM | POA: Diagnosis not present

## 2016-05-19 DIAGNOSIS — L400 Psoriasis vulgaris: Secondary | ICD-10-CM | POA: Diagnosis not present

## 2016-05-21 DIAGNOSIS — L400 Psoriasis vulgaris: Secondary | ICD-10-CM | POA: Diagnosis not present

## 2016-05-26 DIAGNOSIS — L400 Psoriasis vulgaris: Secondary | ICD-10-CM | POA: Diagnosis not present

## 2016-05-29 DIAGNOSIS — L400 Psoriasis vulgaris: Secondary | ICD-10-CM | POA: Diagnosis not present

## 2016-06-02 DIAGNOSIS — L400 Psoriasis vulgaris: Secondary | ICD-10-CM | POA: Diagnosis not present

## 2016-06-05 DIAGNOSIS — E538 Deficiency of other specified B group vitamins: Secondary | ICD-10-CM | POA: Diagnosis not present

## 2016-06-05 DIAGNOSIS — L400 Psoriasis vulgaris: Secondary | ICD-10-CM | POA: Diagnosis not present

## 2016-06-09 DIAGNOSIS — L400 Psoriasis vulgaris: Secondary | ICD-10-CM | POA: Diagnosis not present

## 2016-06-12 DIAGNOSIS — L400 Psoriasis vulgaris: Secondary | ICD-10-CM | POA: Diagnosis not present

## 2016-06-19 DIAGNOSIS — L400 Psoriasis vulgaris: Secondary | ICD-10-CM | POA: Diagnosis not present

## 2016-06-23 DIAGNOSIS — L400 Psoriasis vulgaris: Secondary | ICD-10-CM | POA: Diagnosis not present

## 2016-06-26 DIAGNOSIS — L400 Psoriasis vulgaris: Secondary | ICD-10-CM | POA: Diagnosis not present

## 2016-06-30 DIAGNOSIS — L400 Psoriasis vulgaris: Secondary | ICD-10-CM | POA: Diagnosis not present

## 2016-07-03 DIAGNOSIS — E538 Deficiency of other specified B group vitamins: Secondary | ICD-10-CM | POA: Diagnosis not present

## 2016-07-03 DIAGNOSIS — L400 Psoriasis vulgaris: Secondary | ICD-10-CM | POA: Diagnosis not present

## 2016-07-07 DIAGNOSIS — L400 Psoriasis vulgaris: Secondary | ICD-10-CM | POA: Diagnosis not present

## 2016-07-08 ENCOUNTER — Encounter: Payer: Self-pay | Admitting: Gynecology

## 2016-07-10 DIAGNOSIS — L400 Psoriasis vulgaris: Secondary | ICD-10-CM | POA: Diagnosis not present

## 2016-07-14 DIAGNOSIS — L400 Psoriasis vulgaris: Secondary | ICD-10-CM | POA: Diagnosis not present

## 2016-07-17 DIAGNOSIS — L400 Psoriasis vulgaris: Secondary | ICD-10-CM | POA: Diagnosis not present

## 2016-07-21 DIAGNOSIS — L400 Psoriasis vulgaris: Secondary | ICD-10-CM | POA: Diagnosis not present

## 2016-07-24 DIAGNOSIS — L400 Psoriasis vulgaris: Secondary | ICD-10-CM | POA: Diagnosis not present

## 2016-07-28 DIAGNOSIS — L400 Psoriasis vulgaris: Secondary | ICD-10-CM | POA: Diagnosis not present

## 2016-07-31 DIAGNOSIS — L400 Psoriasis vulgaris: Secondary | ICD-10-CM | POA: Diagnosis not present

## 2016-07-31 DIAGNOSIS — E538 Deficiency of other specified B group vitamins: Secondary | ICD-10-CM | POA: Diagnosis not present

## 2016-08-04 DIAGNOSIS — L400 Psoriasis vulgaris: Secondary | ICD-10-CM | POA: Diagnosis not present

## 2016-08-07 DIAGNOSIS — L400 Psoriasis vulgaris: Secondary | ICD-10-CM | POA: Diagnosis not present

## 2016-08-11 DIAGNOSIS — L400 Psoriasis vulgaris: Secondary | ICD-10-CM | POA: Diagnosis not present

## 2016-08-14 DIAGNOSIS — L400 Psoriasis vulgaris: Secondary | ICD-10-CM | POA: Diagnosis not present

## 2016-08-18 DIAGNOSIS — L400 Psoriasis vulgaris: Secondary | ICD-10-CM | POA: Diagnosis not present

## 2016-08-21 DIAGNOSIS — L400 Psoriasis vulgaris: Secondary | ICD-10-CM | POA: Diagnosis not present

## 2016-08-25 DIAGNOSIS — L400 Psoriasis vulgaris: Secondary | ICD-10-CM | POA: Diagnosis not present

## 2016-08-28 DIAGNOSIS — L400 Psoriasis vulgaris: Secondary | ICD-10-CM | POA: Diagnosis not present

## 2016-08-28 DIAGNOSIS — E538 Deficiency of other specified B group vitamins: Secondary | ICD-10-CM | POA: Diagnosis not present

## 2016-09-01 DIAGNOSIS — L400 Psoriasis vulgaris: Secondary | ICD-10-CM | POA: Diagnosis not present

## 2016-09-04 DIAGNOSIS — L400 Psoriasis vulgaris: Secondary | ICD-10-CM | POA: Diagnosis not present

## 2016-09-08 DIAGNOSIS — L400 Psoriasis vulgaris: Secondary | ICD-10-CM | POA: Diagnosis not present

## 2016-09-11 DIAGNOSIS — L400 Psoriasis vulgaris: Secondary | ICD-10-CM | POA: Diagnosis not present

## 2016-09-18 DIAGNOSIS — L400 Psoriasis vulgaris: Secondary | ICD-10-CM | POA: Diagnosis not present

## 2016-09-22 DIAGNOSIS — L400 Psoriasis vulgaris: Secondary | ICD-10-CM | POA: Diagnosis not present

## 2016-09-25 DIAGNOSIS — E538 Deficiency of other specified B group vitamins: Secondary | ICD-10-CM | POA: Diagnosis not present

## 2016-09-25 DIAGNOSIS — L400 Psoriasis vulgaris: Secondary | ICD-10-CM | POA: Diagnosis not present

## 2016-09-29 DIAGNOSIS — L400 Psoriasis vulgaris: Secondary | ICD-10-CM | POA: Diagnosis not present

## 2016-10-02 DIAGNOSIS — L400 Psoriasis vulgaris: Secondary | ICD-10-CM | POA: Diagnosis not present

## 2016-10-06 DIAGNOSIS — L400 Psoriasis vulgaris: Secondary | ICD-10-CM | POA: Diagnosis not present

## 2016-10-09 DIAGNOSIS — L400 Psoriasis vulgaris: Secondary | ICD-10-CM | POA: Diagnosis not present

## 2016-10-13 DIAGNOSIS — L400 Psoriasis vulgaris: Secondary | ICD-10-CM | POA: Diagnosis not present

## 2016-10-16 DIAGNOSIS — L400 Psoriasis vulgaris: Secondary | ICD-10-CM | POA: Diagnosis not present

## 2016-10-23 DIAGNOSIS — E538 Deficiency of other specified B group vitamins: Secondary | ICD-10-CM | POA: Diagnosis not present

## 2016-10-23 DIAGNOSIS — L400 Psoriasis vulgaris: Secondary | ICD-10-CM | POA: Diagnosis not present

## 2016-10-30 DIAGNOSIS — L400 Psoriasis vulgaris: Secondary | ICD-10-CM | POA: Diagnosis not present

## 2016-11-03 DIAGNOSIS — L400 Psoriasis vulgaris: Secondary | ICD-10-CM | POA: Diagnosis not present

## 2016-11-06 DIAGNOSIS — L400 Psoriasis vulgaris: Secondary | ICD-10-CM | POA: Diagnosis not present

## 2016-11-10 DIAGNOSIS — L400 Psoriasis vulgaris: Secondary | ICD-10-CM | POA: Diagnosis not present

## 2016-11-13 DIAGNOSIS — L400 Psoriasis vulgaris: Secondary | ICD-10-CM | POA: Diagnosis not present

## 2016-11-17 DIAGNOSIS — L400 Psoriasis vulgaris: Secondary | ICD-10-CM | POA: Diagnosis not present

## 2016-11-20 DIAGNOSIS — E538 Deficiency of other specified B group vitamins: Secondary | ICD-10-CM | POA: Diagnosis not present

## 2016-11-20 DIAGNOSIS — L400 Psoriasis vulgaris: Secondary | ICD-10-CM | POA: Diagnosis not present

## 2016-11-24 DIAGNOSIS — L400 Psoriasis vulgaris: Secondary | ICD-10-CM | POA: Diagnosis not present

## 2016-11-27 DIAGNOSIS — L400 Psoriasis vulgaris: Secondary | ICD-10-CM | POA: Diagnosis not present

## 2016-12-01 DIAGNOSIS — L400 Psoriasis vulgaris: Secondary | ICD-10-CM | POA: Diagnosis not present

## 2016-12-08 DIAGNOSIS — L400 Psoriasis vulgaris: Secondary | ICD-10-CM | POA: Diagnosis not present

## 2016-12-11 DIAGNOSIS — L400 Psoriasis vulgaris: Secondary | ICD-10-CM | POA: Diagnosis not present

## 2016-12-15 DIAGNOSIS — L400 Psoriasis vulgaris: Secondary | ICD-10-CM | POA: Diagnosis not present

## 2016-12-18 DIAGNOSIS — L400 Psoriasis vulgaris: Secondary | ICD-10-CM | POA: Diagnosis not present

## 2016-12-18 DIAGNOSIS — E538 Deficiency of other specified B group vitamins: Secondary | ICD-10-CM | POA: Diagnosis not present

## 2016-12-25 DIAGNOSIS — L400 Psoriasis vulgaris: Secondary | ICD-10-CM | POA: Diagnosis not present

## 2016-12-29 DIAGNOSIS — L400 Psoriasis vulgaris: Secondary | ICD-10-CM | POA: Diagnosis not present

## 2017-01-01 DIAGNOSIS — L400 Psoriasis vulgaris: Secondary | ICD-10-CM | POA: Diagnosis not present

## 2017-01-05 DIAGNOSIS — L400 Psoriasis vulgaris: Secondary | ICD-10-CM | POA: Diagnosis not present

## 2017-01-08 DIAGNOSIS — L400 Psoriasis vulgaris: Secondary | ICD-10-CM | POA: Diagnosis not present

## 2017-01-12 DIAGNOSIS — L400 Psoriasis vulgaris: Secondary | ICD-10-CM | POA: Diagnosis not present

## 2017-01-19 DIAGNOSIS — E538 Deficiency of other specified B group vitamins: Secondary | ICD-10-CM | POA: Diagnosis not present

## 2017-01-19 DIAGNOSIS — L400 Psoriasis vulgaris: Secondary | ICD-10-CM | POA: Diagnosis not present

## 2017-01-22 ENCOUNTER — Encounter: Payer: Self-pay | Admitting: Gynecology

## 2017-01-22 ENCOUNTER — Ambulatory Visit (INDEPENDENT_AMBULATORY_CARE_PROVIDER_SITE_OTHER): Payer: Medicare Other | Admitting: Gynecology

## 2017-01-22 VITALS — BP 124/82

## 2017-01-22 DIAGNOSIS — N898 Other specified noninflammatory disorders of vagina: Secondary | ICD-10-CM | POA: Diagnosis not present

## 2017-01-22 DIAGNOSIS — L900 Lichen sclerosus et atrophicus: Secondary | ICD-10-CM

## 2017-01-22 DIAGNOSIS — L292 Pruritus vulvae: Secondary | ICD-10-CM | POA: Diagnosis not present

## 2017-01-22 LAB — WET PREP FOR TRICH, YEAST, CLUE

## 2017-01-22 MED ORDER — FLUCONAZOLE 150 MG PO TABS: 150.0000 mg | ORAL_TABLET | Freq: Every day | ORAL | 0 refills | Status: AC

## 2017-01-22 NOTE — Patient Instructions (Signed)
Take diflucan pill daily for 3 days

## 2017-01-22 NOTE — Progress Notes (Signed)
    Sharon Guerra [birth year] [id number]        71 y.o.  [id number] former patient of Dr. Toney Rakes presents complaining of vulvar itching.  History of lichen sclerosis using clobetasol cream with usual good results.  Notes that this does not seem to be helping.  Itching started after her colonoscopy.  No discharge or odor.  No urinary symptoms such as frequency dysuria urgency low back pain fever or chills.  Past medical history,surgical history, problem list, medications, allergies, family history and social history were all reviewed and documented in the EPIC chart.  Directed ROS with pertinent positives and negatives documented in the history of present illness/assessment and plan.  Exam: Caryn Bee assistant Vitals:   01/22/17 0914  BP: 124/82   General appearance:  Normal Abdomen soft nontender without masses guarding rebound Pelvic external BUS vagina with atrophic changes.  Symmetrical blanching of the skin from the periclitoral hood along both labia bilaterally approaching the posterior fourchette consistent with lichen sclerosis.  Cervix with atrophic changes.  Uterus grossly normal size midline mobile nontender.  Adnexa without masses or tenderness.  Assessment/Plan:  71 y.o. [id number] history and exam as above.  Wet prep is positive for yeast.  I think this contributes to her itching on top of her lichen sclerosus and atrophic vaginal changes.  We will treat with Diflucan 150 mg daily times 3 days.  She will continue to use her clobetasol nightly for now.  I reminded her that she is overdue for her annual exam and she is going to schedule that also.  She will follow-up if her symptoms persist, worsen or recur.    Anastasio Auerbach MD, 9:23 AM 01/22/2017

## 2017-01-22 NOTE — Addendum Note (Signed)
Addended by: Nelva Nay on: 01/22/2017 11:19 AM   Modules accepted: Orders

## 2017-01-29 DIAGNOSIS — L400 Psoriasis vulgaris: Secondary | ICD-10-CM | POA: Diagnosis not present

## 2017-02-05 DIAGNOSIS — L400 Psoriasis vulgaris: Secondary | ICD-10-CM | POA: Diagnosis not present

## 2017-02-09 DIAGNOSIS — L400 Psoriasis vulgaris: Secondary | ICD-10-CM | POA: Diagnosis not present

## 2017-02-12 DIAGNOSIS — L400 Psoriasis vulgaris: Secondary | ICD-10-CM | POA: Diagnosis not present

## 2017-02-19 DIAGNOSIS — L400 Psoriasis vulgaris: Secondary | ICD-10-CM | POA: Diagnosis not present

## 2017-02-19 DIAGNOSIS — E538 Deficiency of other specified B group vitamins: Secondary | ICD-10-CM | POA: Diagnosis not present

## 2017-02-26 DIAGNOSIS — L400 Psoriasis vulgaris: Secondary | ICD-10-CM | POA: Diagnosis not present

## 2017-03-02 DIAGNOSIS — L400 Psoriasis vulgaris: Secondary | ICD-10-CM | POA: Diagnosis not present

## 2017-03-05 DIAGNOSIS — L400 Psoriasis vulgaris: Secondary | ICD-10-CM | POA: Diagnosis not present

## 2017-03-09 DIAGNOSIS — L400 Psoriasis vulgaris: Secondary | ICD-10-CM | POA: Diagnosis not present

## 2017-03-12 DIAGNOSIS — L400 Psoriasis vulgaris: Secondary | ICD-10-CM | POA: Diagnosis not present

## 2017-03-19 DIAGNOSIS — L400 Psoriasis vulgaris: Secondary | ICD-10-CM | POA: Diagnosis not present

## 2017-03-23 DIAGNOSIS — L400 Psoriasis vulgaris: Secondary | ICD-10-CM | POA: Diagnosis not present

## 2017-03-26 DIAGNOSIS — L400 Psoriasis vulgaris: Secondary | ICD-10-CM | POA: Diagnosis not present

## 2017-03-30 DIAGNOSIS — L400 Psoriasis vulgaris: Secondary | ICD-10-CM | POA: Diagnosis not present

## 2017-04-02 DIAGNOSIS — L400 Psoriasis vulgaris: Secondary | ICD-10-CM | POA: Diagnosis not present

## 2017-04-06 DIAGNOSIS — L400 Psoriasis vulgaris: Secondary | ICD-10-CM | POA: Diagnosis not present

## 2017-04-09 DIAGNOSIS — L400 Psoriasis vulgaris: Secondary | ICD-10-CM | POA: Diagnosis not present

## 2017-04-13 DIAGNOSIS — L400 Psoriasis vulgaris: Secondary | ICD-10-CM | POA: Diagnosis not present

## 2017-04-14 DIAGNOSIS — E538 Deficiency of other specified B group vitamins: Secondary | ICD-10-CM | POA: Diagnosis not present

## 2017-04-20 DIAGNOSIS — L400 Psoriasis vulgaris: Secondary | ICD-10-CM | POA: Diagnosis not present

## 2017-04-23 DIAGNOSIS — L400 Psoriasis vulgaris: Secondary | ICD-10-CM | POA: Diagnosis not present

## 2017-04-26 DIAGNOSIS — M2141 Flat foot [pes planus] (acquired), right foot: Secondary | ICD-10-CM | POA: Diagnosis not present

## 2017-04-26 DIAGNOSIS — E538 Deficiency of other specified B group vitamins: Secondary | ICD-10-CM | POA: Diagnosis not present

## 2017-04-26 DIAGNOSIS — E7800 Pure hypercholesterolemia, unspecified: Secondary | ICD-10-CM | POA: Diagnosis not present

## 2017-04-26 DIAGNOSIS — Z0000 Encounter for general adult medical examination without abnormal findings: Secondary | ICD-10-CM | POA: Diagnosis not present

## 2017-04-26 DIAGNOSIS — L409 Psoriasis, unspecified: Secondary | ICD-10-CM | POA: Diagnosis not present

## 2017-04-26 DIAGNOSIS — Z1159 Encounter for screening for other viral diseases: Secondary | ICD-10-CM | POA: Diagnosis not present

## 2017-04-27 DIAGNOSIS — L400 Psoriasis vulgaris: Secondary | ICD-10-CM | POA: Diagnosis not present

## 2017-04-30 DIAGNOSIS — L400 Psoriasis vulgaris: Secondary | ICD-10-CM | POA: Diagnosis not present

## 2017-05-04 DIAGNOSIS — L400 Psoriasis vulgaris: Secondary | ICD-10-CM | POA: Diagnosis not present

## 2017-05-07 DIAGNOSIS — Z1231 Encounter for screening mammogram for malignant neoplasm of breast: Secondary | ICD-10-CM | POA: Diagnosis not present

## 2017-05-07 DIAGNOSIS — L400 Psoriasis vulgaris: Secondary | ICD-10-CM | POA: Diagnosis not present

## 2017-05-07 DIAGNOSIS — Z853 Personal history of malignant neoplasm of breast: Secondary | ICD-10-CM | POA: Diagnosis not present

## 2017-05-11 DIAGNOSIS — L400 Psoriasis vulgaris: Secondary | ICD-10-CM | POA: Diagnosis not present

## 2017-05-14 DIAGNOSIS — L400 Psoriasis vulgaris: Secondary | ICD-10-CM | POA: Diagnosis not present

## 2017-05-14 DIAGNOSIS — E538 Deficiency of other specified B group vitamins: Secondary | ICD-10-CM | POA: Diagnosis not present

## 2017-05-18 DIAGNOSIS — L400 Psoriasis vulgaris: Secondary | ICD-10-CM | POA: Diagnosis not present

## 2017-05-21 DIAGNOSIS — L400 Psoriasis vulgaris: Secondary | ICD-10-CM | POA: Diagnosis not present

## 2017-05-25 DIAGNOSIS — L400 Psoriasis vulgaris: Secondary | ICD-10-CM | POA: Diagnosis not present

## 2017-05-28 DIAGNOSIS — L400 Psoriasis vulgaris: Secondary | ICD-10-CM | POA: Diagnosis not present

## 2017-06-01 DIAGNOSIS — L400 Psoriasis vulgaris: Secondary | ICD-10-CM | POA: Diagnosis not present

## 2017-06-04 DIAGNOSIS — L400 Psoriasis vulgaris: Secondary | ICD-10-CM | POA: Diagnosis not present

## 2017-06-08 DIAGNOSIS — L400 Psoriasis vulgaris: Secondary | ICD-10-CM | POA: Diagnosis not present

## 2017-06-15 DIAGNOSIS — E538 Deficiency of other specified B group vitamins: Secondary | ICD-10-CM | POA: Diagnosis not present

## 2017-06-18 DIAGNOSIS — L400 Psoriasis vulgaris: Secondary | ICD-10-CM | POA: Diagnosis not present

## 2017-06-22 DIAGNOSIS — L400 Psoriasis vulgaris: Secondary | ICD-10-CM | POA: Diagnosis not present

## 2017-06-29 DIAGNOSIS — L400 Psoriasis vulgaris: Secondary | ICD-10-CM | POA: Diagnosis not present

## 2017-06-29 DIAGNOSIS — H0261 Xanthelasma of right upper eyelid: Secondary | ICD-10-CM | POA: Diagnosis not present

## 2017-06-29 DIAGNOSIS — H0264 Xanthelasma of left upper eyelid: Secondary | ICD-10-CM | POA: Diagnosis not present

## 2017-07-06 DIAGNOSIS — H0261 Xanthelasma of right upper eyelid: Secondary | ICD-10-CM | POA: Diagnosis not present

## 2017-07-06 DIAGNOSIS — L400 Psoriasis vulgaris: Secondary | ICD-10-CM | POA: Diagnosis not present

## 2017-07-06 DIAGNOSIS — H0264 Xanthelasma of left upper eyelid: Secondary | ICD-10-CM | POA: Diagnosis not present

## 2017-07-09 DIAGNOSIS — L400 Psoriasis vulgaris: Secondary | ICD-10-CM | POA: Diagnosis not present

## 2017-07-13 DIAGNOSIS — L400 Psoriasis vulgaris: Secondary | ICD-10-CM | POA: Diagnosis not present

## 2017-07-13 DIAGNOSIS — E538 Deficiency of other specified B group vitamins: Secondary | ICD-10-CM | POA: Diagnosis not present

## 2017-07-16 DIAGNOSIS — L400 Psoriasis vulgaris: Secondary | ICD-10-CM | POA: Diagnosis not present

## 2017-07-20 DIAGNOSIS — L400 Psoriasis vulgaris: Secondary | ICD-10-CM | POA: Diagnosis not present

## 2017-07-23 DIAGNOSIS — L400 Psoriasis vulgaris: Secondary | ICD-10-CM | POA: Diagnosis not present

## 2017-07-27 DIAGNOSIS — L400 Psoriasis vulgaris: Secondary | ICD-10-CM | POA: Diagnosis not present

## 2017-08-03 DIAGNOSIS — L400 Psoriasis vulgaris: Secondary | ICD-10-CM | POA: Diagnosis not present

## 2017-08-06 DIAGNOSIS — L400 Psoriasis vulgaris: Secondary | ICD-10-CM | POA: Diagnosis not present

## 2017-08-10 DIAGNOSIS — E538 Deficiency of other specified B group vitamins: Secondary | ICD-10-CM | POA: Diagnosis not present

## 2017-08-10 DIAGNOSIS — L400 Psoriasis vulgaris: Secondary | ICD-10-CM | POA: Diagnosis not present

## 2017-08-13 DIAGNOSIS — L400 Psoriasis vulgaris: Secondary | ICD-10-CM | POA: Diagnosis not present

## 2017-08-17 DIAGNOSIS — L400 Psoriasis vulgaris: Secondary | ICD-10-CM | POA: Diagnosis not present

## 2017-08-20 DIAGNOSIS — L400 Psoriasis vulgaris: Secondary | ICD-10-CM | POA: Diagnosis not present

## 2017-08-24 DIAGNOSIS — L400 Psoriasis vulgaris: Secondary | ICD-10-CM | POA: Diagnosis not present

## 2017-08-27 DIAGNOSIS — L400 Psoriasis vulgaris: Secondary | ICD-10-CM | POA: Diagnosis not present

## 2017-09-07 DIAGNOSIS — L400 Psoriasis vulgaris: Secondary | ICD-10-CM | POA: Diagnosis not present

## 2017-09-09 DIAGNOSIS — E538 Deficiency of other specified B group vitamins: Secondary | ICD-10-CM | POA: Diagnosis not present

## 2017-09-10 DIAGNOSIS — L400 Psoriasis vulgaris: Secondary | ICD-10-CM | POA: Diagnosis not present

## 2017-09-14 DIAGNOSIS — L400 Psoriasis vulgaris: Secondary | ICD-10-CM | POA: Diagnosis not present

## 2017-09-17 DIAGNOSIS — L400 Psoriasis vulgaris: Secondary | ICD-10-CM | POA: Diagnosis not present

## 2017-09-21 DIAGNOSIS — L400 Psoriasis vulgaris: Secondary | ICD-10-CM | POA: Diagnosis not present

## 2017-09-24 DIAGNOSIS — L400 Psoriasis vulgaris: Secondary | ICD-10-CM | POA: Diagnosis not present

## 2017-09-28 DIAGNOSIS — L400 Psoriasis vulgaris: Secondary | ICD-10-CM | POA: Diagnosis not present

## 2017-10-01 DIAGNOSIS — L400 Psoriasis vulgaris: Secondary | ICD-10-CM | POA: Diagnosis not present

## 2017-10-04 DIAGNOSIS — E538 Deficiency of other specified B group vitamins: Secondary | ICD-10-CM | POA: Diagnosis not present

## 2017-10-05 DIAGNOSIS — L400 Psoriasis vulgaris: Secondary | ICD-10-CM | POA: Diagnosis not present

## 2017-10-08 DIAGNOSIS — L400 Psoriasis vulgaris: Secondary | ICD-10-CM | POA: Diagnosis not present

## 2017-10-12 DIAGNOSIS — L400 Psoriasis vulgaris: Secondary | ICD-10-CM | POA: Diagnosis not present

## 2017-10-15 DIAGNOSIS — L400 Psoriasis vulgaris: Secondary | ICD-10-CM | POA: Diagnosis not present

## 2017-10-26 DIAGNOSIS — L400 Psoriasis vulgaris: Secondary | ICD-10-CM | POA: Diagnosis not present

## 2017-10-29 DIAGNOSIS — L400 Psoriasis vulgaris: Secondary | ICD-10-CM | POA: Diagnosis not present

## 2017-11-02 DIAGNOSIS — L400 Psoriasis vulgaris: Secondary | ICD-10-CM | POA: Diagnosis not present

## 2017-11-05 DIAGNOSIS — E538 Deficiency of other specified B group vitamins: Secondary | ICD-10-CM | POA: Diagnosis not present

## 2017-11-05 DIAGNOSIS — L400 Psoriasis vulgaris: Secondary | ICD-10-CM | POA: Diagnosis not present

## 2017-11-09 DIAGNOSIS — L400 Psoriasis vulgaris: Secondary | ICD-10-CM | POA: Diagnosis not present

## 2017-11-19 DIAGNOSIS — L400 Psoriasis vulgaris: Secondary | ICD-10-CM | POA: Diagnosis not present

## 2017-11-23 DIAGNOSIS — L400 Psoriasis vulgaris: Secondary | ICD-10-CM | POA: Diagnosis not present

## 2017-11-26 DIAGNOSIS — L400 Psoriasis vulgaris: Secondary | ICD-10-CM | POA: Diagnosis not present

## 2017-11-30 DIAGNOSIS — L400 Psoriasis vulgaris: Secondary | ICD-10-CM | POA: Diagnosis not present

## 2017-12-03 DIAGNOSIS — E538 Deficiency of other specified B group vitamins: Secondary | ICD-10-CM | POA: Diagnosis not present

## 2017-12-03 DIAGNOSIS — L400 Psoriasis vulgaris: Secondary | ICD-10-CM | POA: Diagnosis not present

## 2017-12-07 DIAGNOSIS — L400 Psoriasis vulgaris: Secondary | ICD-10-CM | POA: Diagnosis not present

## 2017-12-10 DIAGNOSIS — L400 Psoriasis vulgaris: Secondary | ICD-10-CM | POA: Diagnosis not present

## 2017-12-17 DIAGNOSIS — L400 Psoriasis vulgaris: Secondary | ICD-10-CM | POA: Diagnosis not present

## 2017-12-21 DIAGNOSIS — L400 Psoriasis vulgaris: Secondary | ICD-10-CM | POA: Diagnosis not present

## 2017-12-24 DIAGNOSIS — L400 Psoriasis vulgaris: Secondary | ICD-10-CM | POA: Diagnosis not present

## 2017-12-28 DIAGNOSIS — L400 Psoriasis vulgaris: Secondary | ICD-10-CM | POA: Diagnosis not present

## 2017-12-31 DIAGNOSIS — L400 Psoriasis vulgaris: Secondary | ICD-10-CM | POA: Diagnosis not present

## 2017-12-31 DIAGNOSIS — E538 Deficiency of other specified B group vitamins: Secondary | ICD-10-CM | POA: Diagnosis not present

## 2018-01-04 DIAGNOSIS — L400 Psoriasis vulgaris: Secondary | ICD-10-CM | POA: Diagnosis not present

## 2018-01-07 DIAGNOSIS — L400 Psoriasis vulgaris: Secondary | ICD-10-CM | POA: Diagnosis not present

## 2018-01-14 DIAGNOSIS — L400 Psoriasis vulgaris: Secondary | ICD-10-CM | POA: Diagnosis not present

## 2018-01-28 DIAGNOSIS — E538 Deficiency of other specified B group vitamins: Secondary | ICD-10-CM | POA: Diagnosis not present

## 2018-02-25 DIAGNOSIS — E538 Deficiency of other specified B group vitamins: Secondary | ICD-10-CM | POA: Diagnosis not present

## 2018-03-25 DIAGNOSIS — E538 Deficiency of other specified B group vitamins: Secondary | ICD-10-CM | POA: Diagnosis not present

## 2018-04-22 DIAGNOSIS — E538 Deficiency of other specified B group vitamins: Secondary | ICD-10-CM | POA: Diagnosis not present

## 2018-05-09 DIAGNOSIS — Z853 Personal history of malignant neoplasm of breast: Secondary | ICD-10-CM | POA: Diagnosis not present

## 2018-05-09 DIAGNOSIS — Z803 Family history of malignant neoplasm of breast: Secondary | ICD-10-CM | POA: Diagnosis not present

## 2018-05-09 DIAGNOSIS — Z1231 Encounter for screening mammogram for malignant neoplasm of breast: Secondary | ICD-10-CM | POA: Diagnosis not present

## 2018-05-27 DIAGNOSIS — E7800 Pure hypercholesterolemia, unspecified: Secondary | ICD-10-CM | POA: Diagnosis not present

## 2018-05-27 DIAGNOSIS — E538 Deficiency of other specified B group vitamins: Secondary | ICD-10-CM | POA: Diagnosis not present

## 2018-05-27 DIAGNOSIS — L409 Psoriasis, unspecified: Secondary | ICD-10-CM | POA: Diagnosis not present

## 2018-05-27 DIAGNOSIS — Z0000 Encounter for general adult medical examination without abnormal findings: Secondary | ICD-10-CM | POA: Diagnosis not present

## 2018-05-31 DIAGNOSIS — E538 Deficiency of other specified B group vitamins: Secondary | ICD-10-CM | POA: Diagnosis not present

## 2018-06-30 DIAGNOSIS — E538 Deficiency of other specified B group vitamins: Secondary | ICD-10-CM | POA: Diagnosis not present

## 2018-07-28 DIAGNOSIS — E538 Deficiency of other specified B group vitamins: Secondary | ICD-10-CM | POA: Diagnosis not present

## 2018-08-25 DIAGNOSIS — E538 Deficiency of other specified B group vitamins: Secondary | ICD-10-CM | POA: Diagnosis not present

## 2018-09-22 DIAGNOSIS — E538 Deficiency of other specified B group vitamins: Secondary | ICD-10-CM | POA: Diagnosis not present

## 2018-10-21 DIAGNOSIS — E538 Deficiency of other specified B group vitamins: Secondary | ICD-10-CM | POA: Diagnosis not present

## 2018-11-18 DIAGNOSIS — E538 Deficiency of other specified B group vitamins: Secondary | ICD-10-CM | POA: Diagnosis not present

## 2018-11-30 ENCOUNTER — Encounter: Payer: Self-pay | Admitting: Gynecology

## 2018-12-16 DIAGNOSIS — E538 Deficiency of other specified B group vitamins: Secondary | ICD-10-CM | POA: Diagnosis not present

## 2019-01-13 DIAGNOSIS — E538 Deficiency of other specified B group vitamins: Secondary | ICD-10-CM | POA: Diagnosis not present

## 2019-03-10 DIAGNOSIS — E538 Deficiency of other specified B group vitamins: Secondary | ICD-10-CM | POA: Diagnosis not present

## 2019-04-07 DIAGNOSIS — E538 Deficiency of other specified B group vitamins: Secondary | ICD-10-CM | POA: Diagnosis not present

## 2019-05-05 DIAGNOSIS — E538 Deficiency of other specified B group vitamins: Secondary | ICD-10-CM | POA: Diagnosis not present

## 2019-05-18 DIAGNOSIS — Z1231 Encounter for screening mammogram for malignant neoplasm of breast: Secondary | ICD-10-CM | POA: Diagnosis not present

## 2019-06-02 DIAGNOSIS — E669 Obesity, unspecified: Secondary | ICD-10-CM | POA: Diagnosis not present

## 2019-06-02 DIAGNOSIS — E538 Deficiency of other specified B group vitamins: Secondary | ICD-10-CM | POA: Diagnosis not present

## 2019-06-02 DIAGNOSIS — E7800 Pure hypercholesterolemia, unspecified: Secondary | ICD-10-CM | POA: Diagnosis not present

## 2019-06-02 DIAGNOSIS — M25562 Pain in left knee: Secondary | ICD-10-CM | POA: Diagnosis not present

## 2019-06-02 DIAGNOSIS — Z0000 Encounter for general adult medical examination without abnormal findings: Secondary | ICD-10-CM | POA: Diagnosis not present

## 2019-06-02 DIAGNOSIS — L409 Psoriasis, unspecified: Secondary | ICD-10-CM | POA: Diagnosis not present

## 2019-06-30 DIAGNOSIS — L409 Psoriasis, unspecified: Secondary | ICD-10-CM | POA: Diagnosis not present

## 2019-06-30 DIAGNOSIS — E538 Deficiency of other specified B group vitamins: Secondary | ICD-10-CM | POA: Diagnosis not present

## 2019-07-28 DIAGNOSIS — E538 Deficiency of other specified B group vitamins: Secondary | ICD-10-CM | POA: Diagnosis not present

## 2019-08-25 DIAGNOSIS — E538 Deficiency of other specified B group vitamins: Secondary | ICD-10-CM | POA: Diagnosis not present

## 2019-09-26 DIAGNOSIS — E538 Deficiency of other specified B group vitamins: Secondary | ICD-10-CM | POA: Diagnosis not present

## 2019-10-24 DIAGNOSIS — E538 Deficiency of other specified B group vitamins: Secondary | ICD-10-CM | POA: Diagnosis not present

## 2019-11-21 DIAGNOSIS — E538 Deficiency of other specified B group vitamins: Secondary | ICD-10-CM | POA: Diagnosis not present

## 2019-12-19 DIAGNOSIS — E538 Deficiency of other specified B group vitamins: Secondary | ICD-10-CM | POA: Diagnosis not present

## 2020-01-22 DIAGNOSIS — E538 Deficiency of other specified B group vitamins: Secondary | ICD-10-CM | POA: Diagnosis not present

## 2020-05-27 DIAGNOSIS — Z1231 Encounter for screening mammogram for malignant neoplasm of breast: Secondary | ICD-10-CM | POA: Diagnosis not present

## 2020-06-13 DIAGNOSIS — M25562 Pain in left knee: Secondary | ICD-10-CM | POA: Diagnosis not present

## 2020-06-13 DIAGNOSIS — E669 Obesity, unspecified: Secondary | ICD-10-CM | POA: Diagnosis not present

## 2020-06-13 DIAGNOSIS — M8588 Other specified disorders of bone density and structure, other site: Secondary | ICD-10-CM | POA: Diagnosis not present

## 2020-06-13 DIAGNOSIS — E538 Deficiency of other specified B group vitamins: Secondary | ICD-10-CM | POA: Diagnosis not present

## 2020-06-13 DIAGNOSIS — Z0000 Encounter for general adult medical examination without abnormal findings: Secondary | ICD-10-CM | POA: Diagnosis not present

## 2020-06-13 DIAGNOSIS — L409 Psoriasis, unspecified: Secondary | ICD-10-CM | POA: Diagnosis not present

## 2020-06-13 DIAGNOSIS — E7800 Pure hypercholesterolemia, unspecified: Secondary | ICD-10-CM | POA: Diagnosis not present

## 2020-06-26 DIAGNOSIS — Z780 Asymptomatic menopausal state: Secondary | ICD-10-CM | POA: Diagnosis not present

## 2020-06-26 DIAGNOSIS — M8589 Other specified disorders of bone density and structure, multiple sites: Secondary | ICD-10-CM | POA: Diagnosis not present

## 2021-05-27 ENCOUNTER — Emergency Department (HOSPITAL_BASED_OUTPATIENT_CLINIC_OR_DEPARTMENT_OTHER): Payer: Medicare HMO | Admitting: Radiology

## 2021-05-27 ENCOUNTER — Encounter (HOSPITAL_BASED_OUTPATIENT_CLINIC_OR_DEPARTMENT_OTHER): Payer: Self-pay

## 2021-05-27 ENCOUNTER — Emergency Department (HOSPITAL_BASED_OUTPATIENT_CLINIC_OR_DEPARTMENT_OTHER)
Admission: EM | Admit: 2021-05-27 | Discharge: 2021-05-27 | Disposition: A | Discharge status: Home / Self Care | Payer: Medicare HMO | Attending: Emergency Medicine | Admitting: Emergency Medicine

## 2021-05-27 ENCOUNTER — Emergency Department (HOSPITAL_BASED_OUTPATIENT_CLINIC_OR_DEPARTMENT_OTHER): Payer: Medicare HMO

## 2021-05-27 ENCOUNTER — Other Ambulatory Visit: Payer: Self-pay

## 2021-05-27 DIAGNOSIS — S60512A Abrasion of left hand, initial encounter: Secondary | ICD-10-CM | POA: Insufficient documentation

## 2021-05-27 DIAGNOSIS — S0083XA Contusion of other part of head, initial encounter: Secondary | ICD-10-CM | POA: Diagnosis not present

## 2021-05-27 DIAGNOSIS — M50323 Other cervical disc degeneration at C6-C7 level: Secondary | ICD-10-CM | POA: Diagnosis not present

## 2021-05-27 DIAGNOSIS — W01198A Fall on same level from slipping, tripping and stumbling with subsequent striking against other object, initial encounter: Secondary | ICD-10-CM | POA: Insufficient documentation

## 2021-05-27 DIAGNOSIS — M1812 Unilateral primary osteoarthritis of first carpometacarpal joint, left hand: Secondary | ICD-10-CM | POA: Diagnosis not present

## 2021-05-27 DIAGNOSIS — Y92481 Parking lot as the place of occurrence of the external cause: Secondary | ICD-10-CM | POA: Insufficient documentation

## 2021-05-27 DIAGNOSIS — S60511A Abrasion of right hand, initial encounter: Secondary | ICD-10-CM | POA: Diagnosis not present

## 2021-05-27 DIAGNOSIS — S0990XA Unspecified injury of head, initial encounter: Secondary | ICD-10-CM | POA: Diagnosis not present

## 2021-05-27 DIAGNOSIS — W19XXXA Unspecified fall, initial encounter: Secondary | ICD-10-CM

## 2021-05-27 DIAGNOSIS — Z043 Encounter for examination and observation following other accident: Secondary | ICD-10-CM | POA: Diagnosis not present

## 2021-05-27 DIAGNOSIS — S60221A Contusion of right hand, initial encounter: Secondary | ICD-10-CM | POA: Diagnosis not present

## 2021-05-27 DIAGNOSIS — M47812 Spondylosis without myelopathy or radiculopathy, cervical region: Secondary | ICD-10-CM | POA: Diagnosis not present

## 2021-05-27 DIAGNOSIS — M1811 Unilateral primary osteoarthritis of first carpometacarpal joint, right hand: Secondary | ICD-10-CM | POA: Diagnosis not present

## 2021-05-27 NOTE — ED Provider Notes (Addendum)
?[facility] ?Provider Note ? ? ?CSN: [id number] ?Arrival date & time: 05/27/21  1318 ? ?  ? ?History ? ?Chief Complaint  ?Patient presents with  ? Fall  ? ? ?Sharon Guerra is a 75 y.o. female. ? ?Pt is a 76 yo female with no significant pmhx.  She tripped in the parking lot at [employer].  She fell and hit the left side of her face.  She also hit both hands.  No loc.  She was able to ambulate.  The fire department was there buying some food.  They checked her out and told her to come to the ED.  She did not want to take an ambulance and drove herself here.  She has been ambulatory since she fell.  She is not on blood thinners.  ? ? ?  ? ?Home Medications ?Prior to Admission medications   ?Medication Sig Start Date End Date Taking? Authorizing Provider  ?clobetasol cream (TEMOVATE) 0.05 % Apply once to twice a week 03/15/15   Terrance Mass, MD  ?Cyanocobalamin (VITAMIN B 12 PO) Take by mouth.    [provider]  ?fluconazole (DIFLUCAN) 150 MG tablet Take 1 tablet (150 mg total) by mouth daily. 01/22/17   Fontaine, Belinda Block, MD  ?raloxifene (EVISTA) 60 MG tablet Take 1 tablet (60 mg total) by mouth daily. ?Patient not taking: Reported on 03/15/2015 12/12/13   Terrance Mass, MD  ?   ? ?Allergies    ?Penicillins   ? ?Review of Systems   ?Review of Systems  ?HENT:  Positive for facial swelling.   ?Musculoskeletal:   ?     Bilateral hand pain  ?All other systems reviewed and are negative. ? ?Physical Exam ?Updated Vital Signs ?BP (!) 148/77 (BP Location: Right Arm)   Pulse 83   Temp 98.1 ?F (36.7 ?C) (Oral)   Resp 16   Ht 5' 2.5" (1.588 m)   Wt 86.6 kg   SpO2 95%   BMI 34.36 kg/m?  ?Physical Exam ?Vitals and nursing note reviewed.  ?HENT:  ?   Head: Normocephalic.  ?   Comments: Left forehead and cheek bruising and swelling ?   Right Ear: External ear normal.  ?   Left Ear: External ear normal.  ?   Nose: Nose normal.  ?   Mouth/Throat:  ?   Mouth: Mucous  membranes are moist.  ?   Pharynx: Oropharynx is clear.  ?Eyes:  ?   Extraocular Movements: Extraocular movements intact.  ?   Conjunctiva/sclera: Conjunctivae normal.  ?   Pupils: Pupils are equal, round, and reactive to light.  ?Cardiovascular:  ?   Rate and Rhythm: Normal rate and regular rhythm.  ?   Pulses: Normal pulses.  ?   Heart sounds: Normal heart sounds.  ?Pulmonary:  ?   Effort: Pulmonary effort is normal.  ?   Breath sounds: Normal breath sounds.  ?Abdominal:  ?   General: Abdomen is flat. Bowel sounds are normal.  ?   Palpations: Abdomen is soft.  ?Musculoskeletal:     ?   General: Normal range of motion.  ?   Cervical back: Normal range of motion and neck supple.  ?   Comments: Abrasions to the palmar surface of both hands  ?Skin: ?   General: Skin is warm.  ?   Capillary Refill: Capillary refill takes less than 2 seconds.  ?Neurological:  ?   General: No focal deficit present.  ?  Mental Status: She is alert and oriented to person, place, and time.  ?Psychiatric:     ?   Mood and Affect: Mood normal.     ?   Behavior: Behavior normal.  ? ? ?ED Results / Procedures / Treatments   ?Labs ?(all labs ordered are listed, but only abnormal results are displayed) ?Labs Reviewed - No data to display ? ?EKG ?None ? ?Radiology ?CT Head Wo Contrast ? ?Result Date: 05/27/2021 ?CLINICAL DATA:  Fall in grocery store parking lot. EXAM: CT HEAD WITHOUT CONTRAST CT MAXILLOFACIAL WITHOUT CONTRAST CT CERVICAL SPINE WITHOUT CONTRAST TECHNIQUE: Multidetector CT imaging of the head, cervical spine, and maxillofacial structures were performed using the standard protocol without intravenous contrast. Multiplanar CT image reconstructions of the cervical spine and maxillofacial structures were also generated. RADIATION DOSE REDUCTION: This exam was performed according to the departmental dose-optimization program which includes automated exposure control, adjustment of the mA and/or kV according to patient size and/or use of  iterative reconstruction technique. COMPARISON:  None. FINDINGS: CT HEAD FINDINGS Brain: No evidence of acute infarction, hemorrhage, hydrocephalus, extra-axial collection or mass lesion/mass effect. Vascular: No hyperdense vessel or unexpected calcification. Skull: Normal. Negative for fracture or focal lesion. Other: None. CT MAXILLOFACIAL FINDINGS Osseous: No fracture or mandibular dislocation. No destructive process. Orbits: Negative. No traumatic or inflammatory finding. Sinuses: Clear. Soft tissues: Mild left periorbital subcutaneous hematoma or contusion is noted. CT CERVICAL SPINE FINDINGS Alignment: Mild grade 1 anterolisthesis of C4-5 is noted secondary to posterior facet joint hypertrophy. Skull base and vertebrae: No acute fracture. No primary bone lesion or focal pathologic process. Soft tissues and spinal canal: No prevertebral fluid or swelling. No visible canal hematoma. Disc levels: Mild degenerative disc disease is noted at C4-5. Moderate degenerative disc disease is noted at C5-6 and C6-7. Upper chest: Negative. Other: None. IMPRESSION: No acute intracranial abnormality seen. Mild left periorbital subcutaneous hematoma or contusion is noted. No other abnormality seen in maxillofacial region. Multilevel degenerative changes are noted in cervical spine. No acute abnormality is noted. Electronically Signed   By: Marijo Conception M.D.   On: 05/27/2021 14:50  ? ?CT Cervical Spine Wo Contrast ? ?Result Date: 05/27/2021 ?CLINICAL DATA:  Fall in grocery store parking lot. EXAM: CT HEAD WITHOUT CONTRAST CT MAXILLOFACIAL WITHOUT CONTRAST CT CERVICAL SPINE WITHOUT CONTRAST TECHNIQUE: Multidetector CT imaging of the head, cervical spine, and maxillofacial structures were performed using the standard protocol without intravenous contrast. Multiplanar CT image reconstructions of the cervical spine and maxillofacial structures were also generated. RADIATION DOSE REDUCTION: This exam was performed according to the  departmental dose-optimization program which includes automated exposure control, adjustment of the mA and/or kV according to patient size and/or use of iterative reconstruction technique. COMPARISON:  None. FINDINGS: CT HEAD FINDINGS Brain: No evidence of acute infarction, hemorrhage, hydrocephalus, extra-axial collection or mass lesion/mass effect. Vascular: No hyperdense vessel or unexpected calcification. Skull: Normal. Negative for fracture or focal lesion. Other: None. CT MAXILLOFACIAL FINDINGS Osseous: No fracture or mandibular dislocation. No destructive process. Orbits: Negative. No traumatic or inflammatory finding. Sinuses: Clear. Soft tissues: Mild left periorbital subcutaneous hematoma or contusion is noted. CT CERVICAL SPINE FINDINGS Alignment: Mild grade 1 anterolisthesis of C4-5 is noted secondary to posterior facet joint hypertrophy. Skull base and vertebrae: No acute fracture. No primary bone lesion or focal pathologic process. Soft tissues and spinal canal: No prevertebral fluid or swelling. No visible canal hematoma. Disc levels: Mild degenerative disc disease is noted at C4-5. Moderate degenerative disc  disease is noted at C5-6 and C6-7. Upper chest: Negative. Other: None. IMPRESSION: No acute intracranial abnormality seen. Mild left periorbital subcutaneous hematoma or contusion is noted. No other abnormality seen in maxillofacial region. Multilevel degenerative changes are noted in cervical spine. No acute abnormality is noted. Electronically Signed   By: Marijo Conception M.D.   On: 05/27/2021 14:50  ? ?DG Hand Complete Left ? ?Result Date: 05/27/2021 ?CLINICAL DATA:  Minor pain in both hands after falling today osseous demineralization. EXAM: LEFT HAND - COMPLETE 3+ VIEW COMPARISON:  None FINDINGS: Osseous demineralization. Degenerative changes first [facility] joint with joint space narrowing, spur formation and radial subluxation of first metacarpal base. No fracture, dislocation, or bone  destruction. IMPRESSION: Degenerative changes LEFT first CMC joint. No acute osseous abnormalities. Electronically Signed   By: Lavonia Dana M.D.   On: 05/27/2021 14:25  ? ?DG Hand Complete Right ? ?Result Date: 05/27/2021 ?

## 2021-05-27 NOTE — ED Triage Notes (Signed)
Patient here POV from Home. ? ?Patient endorses tripping at [facility]. Patient was avoiding a Vehicle and tripped over the [place]. ? ?Small Abrasion and Pain to Left Hand and Bruising/Pain to Left Eye/Face ? ?No Anticoagulants. No LOC. ? ?NAD Noted during Visit. A&Ox4. GCS 15. Ambulatory.  ?

## 2021-07-03 DIAGNOSIS — Z1231 Encounter for screening mammogram for malignant neoplasm of breast: Secondary | ICD-10-CM | POA: Diagnosis not present

## 2021-07-18 DIAGNOSIS — E538 Deficiency of other specified B group vitamins: Secondary | ICD-10-CM | POA: Diagnosis not present

## 2021-07-18 DIAGNOSIS — E7800 Pure hypercholesterolemia, unspecified: Secondary | ICD-10-CM | POA: Diagnosis not present

## 2021-07-18 DIAGNOSIS — Z0000 Encounter for general adult medical examination without abnormal findings: Secondary | ICD-10-CM | POA: Diagnosis not present

## 2021-07-18 DIAGNOSIS — E669 Obesity, unspecified: Secondary | ICD-10-CM | POA: Diagnosis not present

## 2021-07-18 DIAGNOSIS — M8588 Other specified disorders of bone density and structure, other site: Secondary | ICD-10-CM | POA: Diagnosis not present

## 2021-07-18 DIAGNOSIS — L409 Psoriasis, unspecified: Secondary | ICD-10-CM | POA: Diagnosis not present

## 2022-07-06 DIAGNOSIS — Z90710 Acquired absence of both cervix and uterus: Secondary | ICD-10-CM | POA: Diagnosis not present

## 2022-07-06 DIAGNOSIS — Z853 Personal history of malignant neoplasm of breast: Secondary | ICD-10-CM | POA: Diagnosis not present

## 2022-07-06 DIAGNOSIS — R29890 Loss of height: Secondary | ICD-10-CM | POA: Diagnosis not present

## 2022-07-06 DIAGNOSIS — N951 Menopausal and female climacteric states: Secondary | ICD-10-CM | POA: Diagnosis not present

## 2022-07-06 DIAGNOSIS — Z1231 Encounter for screening mammogram for malignant neoplasm of breast: Secondary | ICD-10-CM | POA: Diagnosis not present

## 2022-07-06 DIAGNOSIS — M810 Age-related osteoporosis without current pathological fracture: Secondary | ICD-10-CM | POA: Diagnosis not present

## 2022-07-06 DIAGNOSIS — E28310 Symptomatic premature menopause: Secondary | ICD-10-CM | POA: Diagnosis not present

## 2022-07-31 DIAGNOSIS — Z1211 Encounter for screening for malignant neoplasm of colon: Secondary | ICD-10-CM | POA: Diagnosis not present

## 2022-07-31 DIAGNOSIS — E538 Deficiency of other specified B group vitamins: Secondary | ICD-10-CM | POA: Diagnosis not present

## 2022-07-31 DIAGNOSIS — F320 Major depressive disorder, single episode, mild: Secondary | ICD-10-CM | POA: Diagnosis not present

## 2022-07-31 DIAGNOSIS — R011 Cardiac murmur, unspecified: Secondary | ICD-10-CM | POA: Diagnosis not present

## 2022-07-31 DIAGNOSIS — E669 Obesity, unspecified: Secondary | ICD-10-CM | POA: Diagnosis not present

## 2022-07-31 DIAGNOSIS — M810 Age-related osteoporosis without current pathological fracture: Secondary | ICD-10-CM | POA: Diagnosis not present

## 2022-07-31 DIAGNOSIS — Z0000 Encounter for general adult medical examination without abnormal findings: Secondary | ICD-10-CM | POA: Diagnosis not present

## 2022-07-31 DIAGNOSIS — L409 Psoriasis, unspecified: Secondary | ICD-10-CM | POA: Diagnosis not present

## 2022-07-31 DIAGNOSIS — E7800 Pure hypercholesterolemia, unspecified: Secondary | ICD-10-CM | POA: Diagnosis not present

## 2022-08-14 DIAGNOSIS — R011 Cardiac murmur, unspecified: Secondary | ICD-10-CM | POA: Diagnosis not present

## 2022-11-03 DIAGNOSIS — E538 Deficiency of other specified B group vitamins: Secondary | ICD-10-CM | POA: Diagnosis not present

## 2022-11-03 DIAGNOSIS — E559 Vitamin D deficiency, unspecified: Secondary | ICD-10-CM | POA: Diagnosis not present

## 2022-12-09 DIAGNOSIS — R059 Cough, unspecified: Secondary | ICD-10-CM | POA: Diagnosis not present

## 2023-01-04 DIAGNOSIS — R011 Cardiac murmur, unspecified: Secondary | ICD-10-CM | POA: Diagnosis not present

## 2023-09-17 IMAGING — DX DG HAND COMPLETE 3+V*L*
3 series · 3 of 3 positions shown · non-contrast
Comparison: None

CLINICAL DATA: Minor pain in both hands after falling today osseous
demineralization.

EXAM:
LEFT HAND - COMPLETE 3+ VIEW

[hand obl]
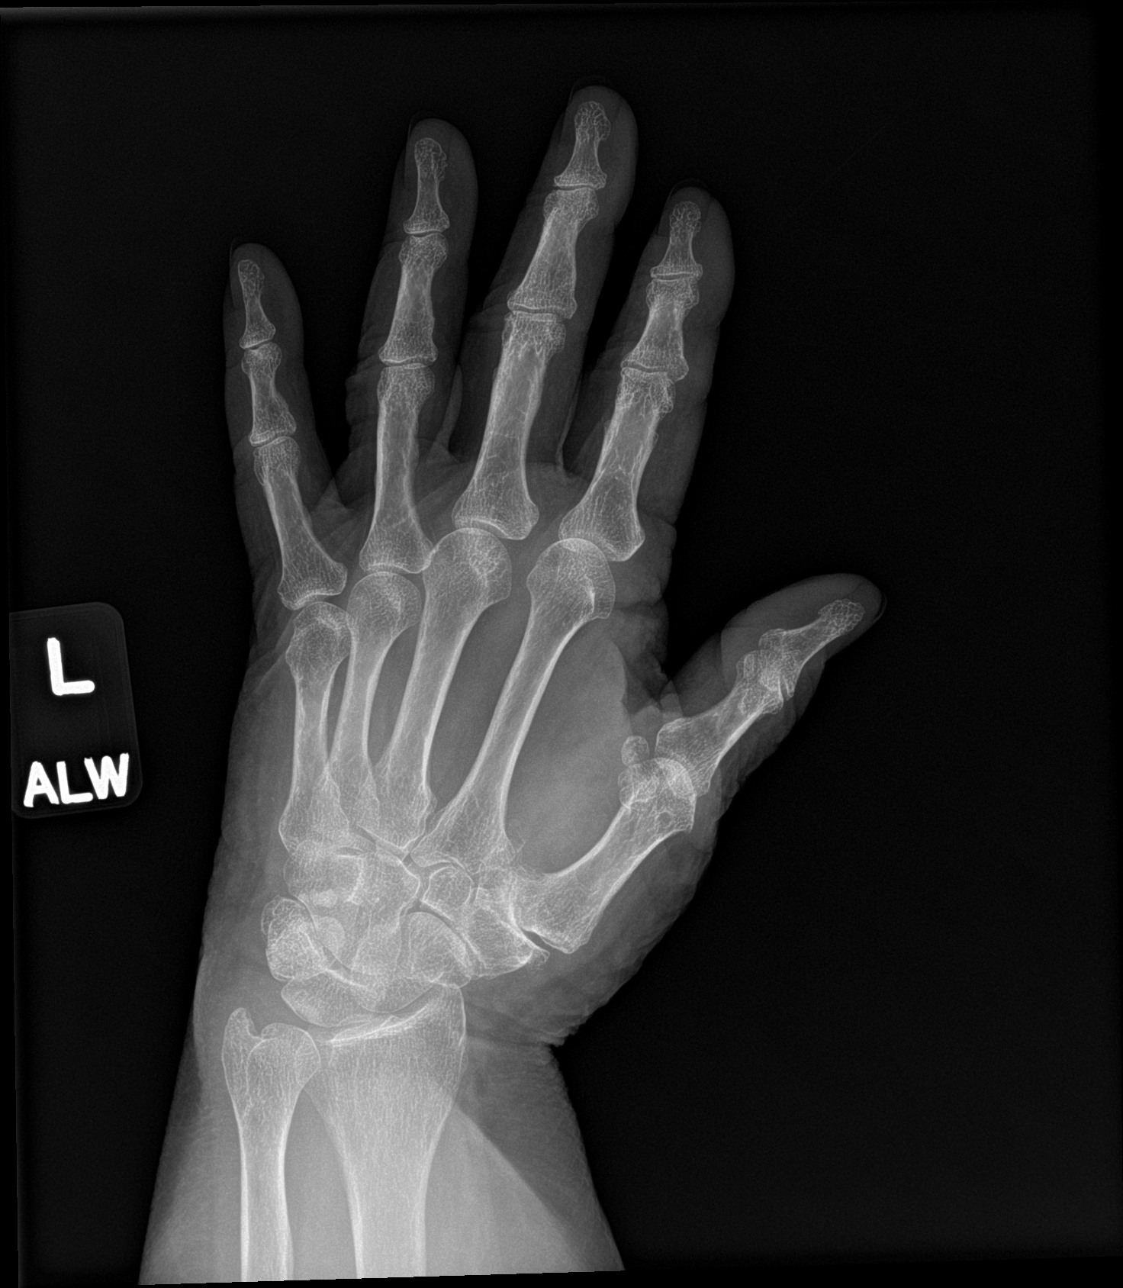

[hand lat]
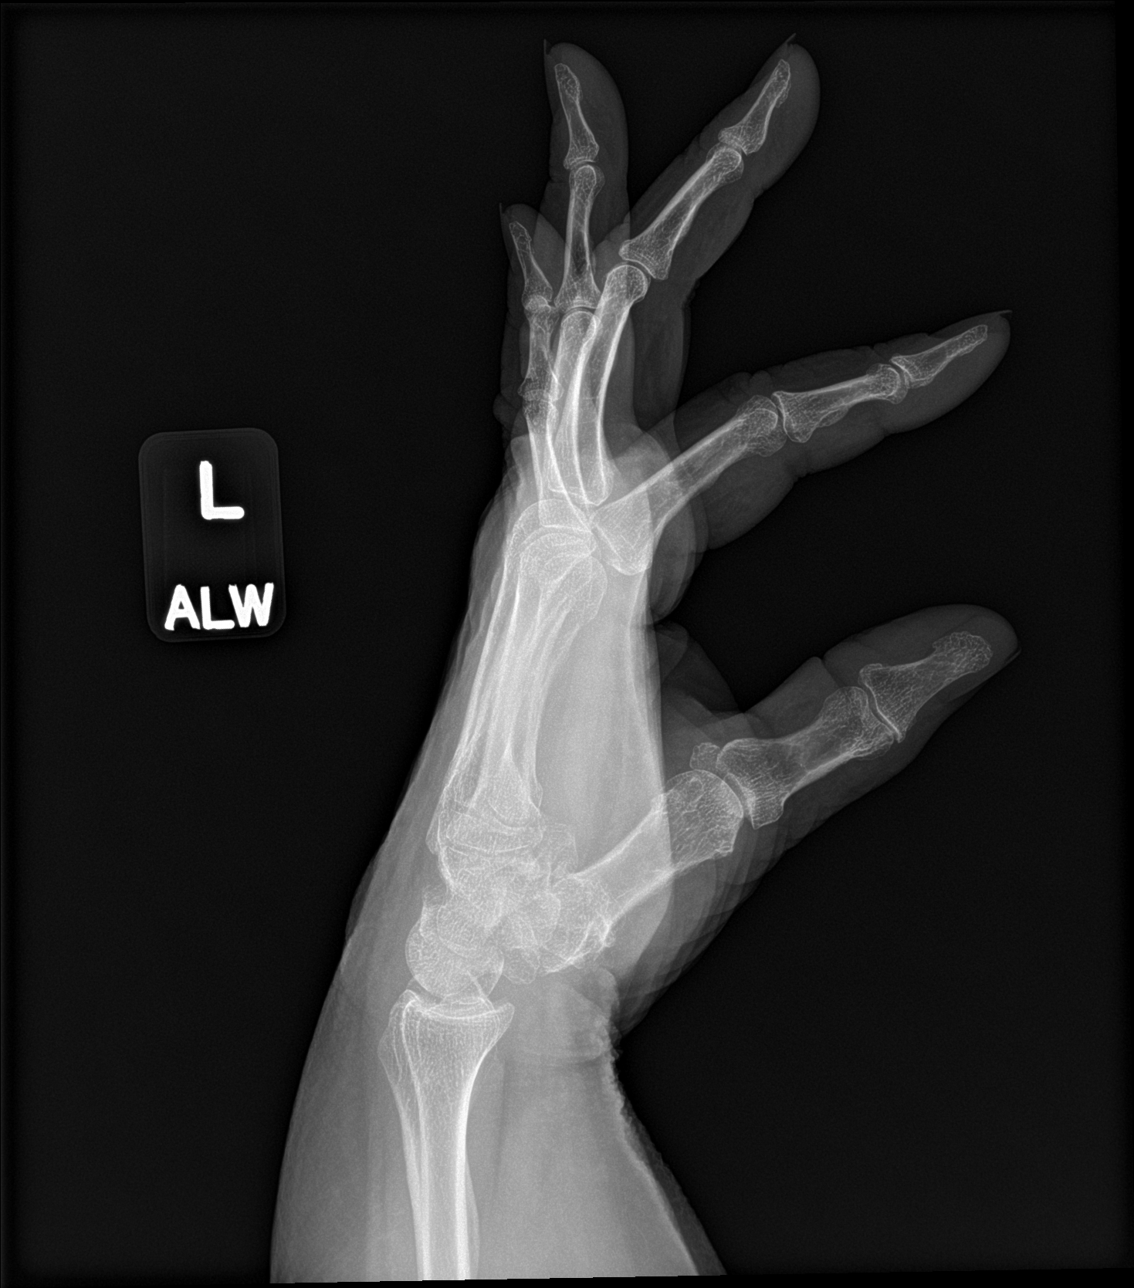

[hand ap]
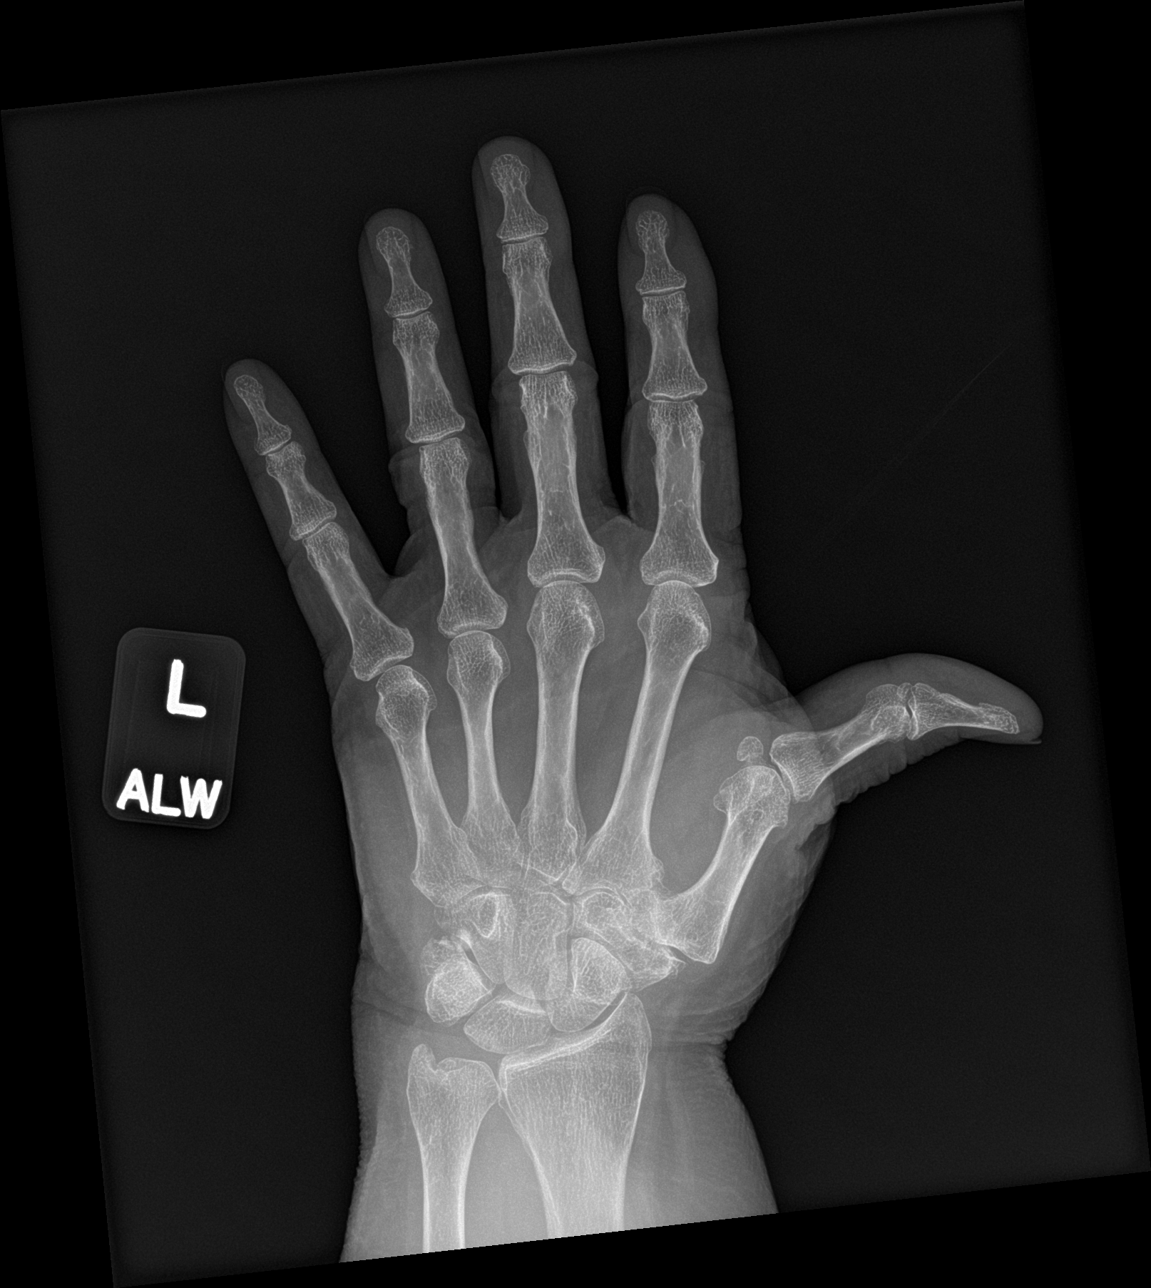

[3 of 3 positions shown; findings below may reference images not displayed]

FINDINGS: Osseous demineralization.

Degenerative changes first CMC joint with joint space narrowing,
spur formation and radial subluxation of first metacarpal base.

No fracture, dislocation, or bone destruction.
IMPRESSION: Degenerative changes LEFT first CMC joint.

No acute osseous abnormalities.

## 2023-09-17 IMAGING — CT CT HEAD W/O CM
4 series · 16 of 47 positions shown, 18 images · non-contrast
Comparison: None.

CLINICAL DATA: Fall in grocery store parking lot.



[Series 2: head wo · axial · 0.43mm/px · z∈[-94,+21]mm · 7 of 31 slices shown, 9 images]
[im 4/31  brain]
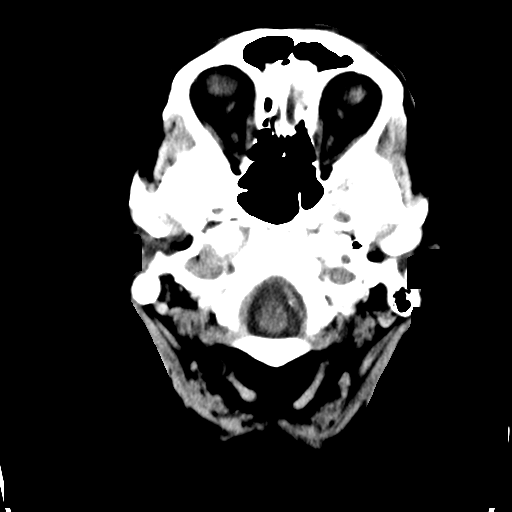
[im 4/31  bone]
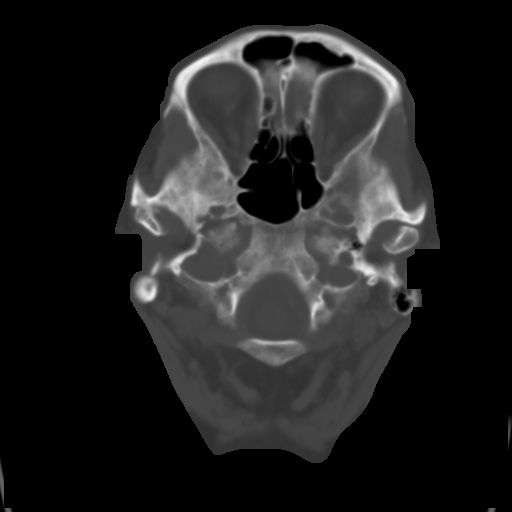
[im 8/31  brain]
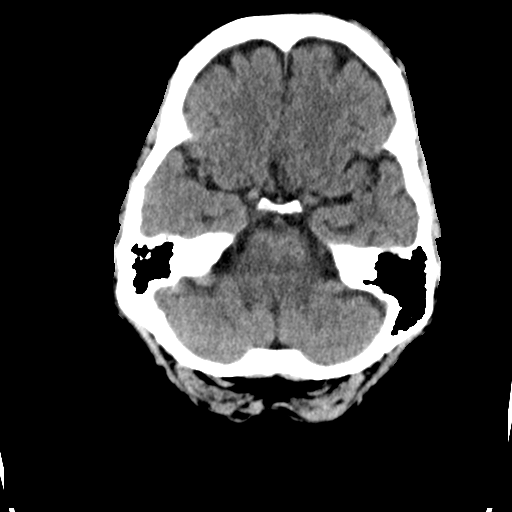
[im 12/31  brain]
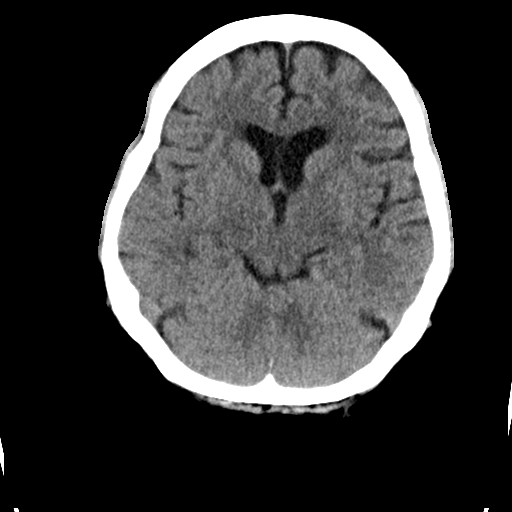
[im 16/31  brain]
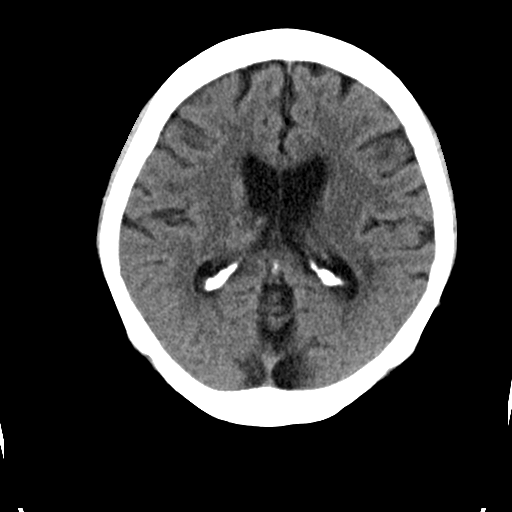
[im 19/31  brain]
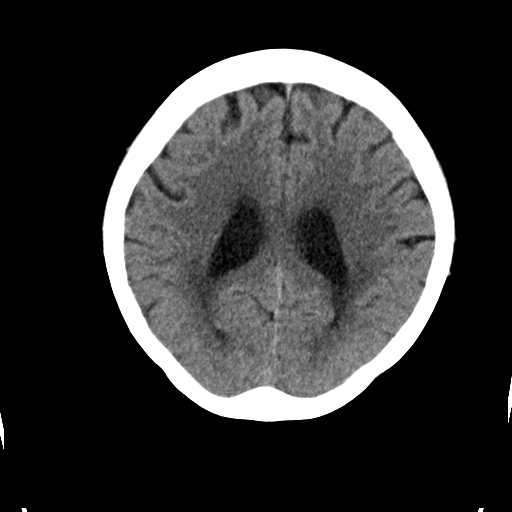
[im 19/31  bone]
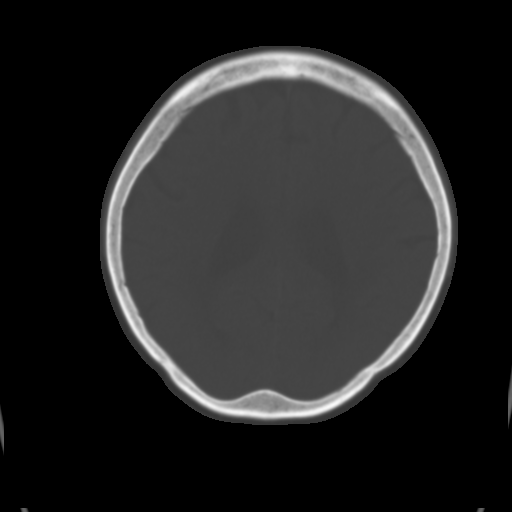
[im 23/31  brain]
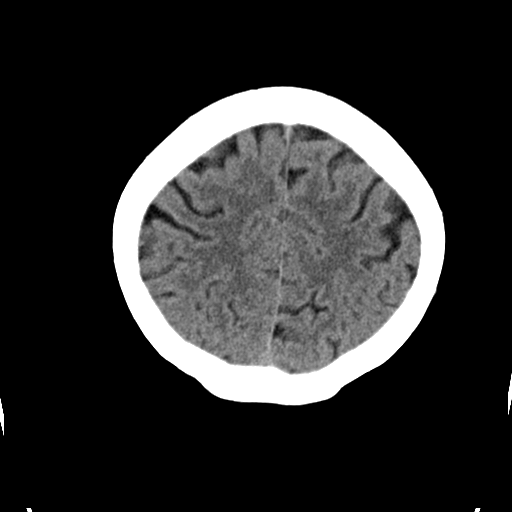
[im 27/31  brain]
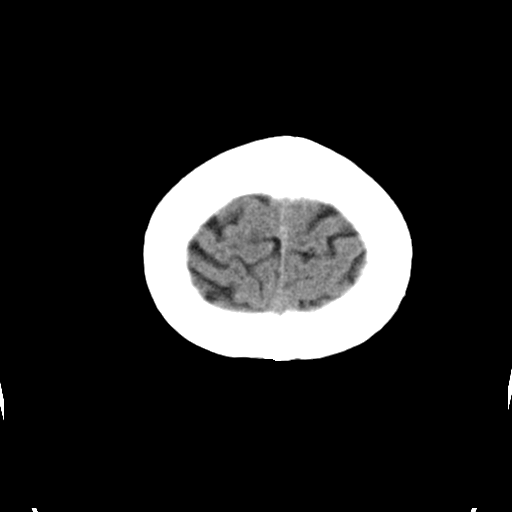

[Series 3: head bone · axial · 0.43mm/px · z∈[-95,-63]mm · 3 of 78 slices shown]
[im 8/78  bone]
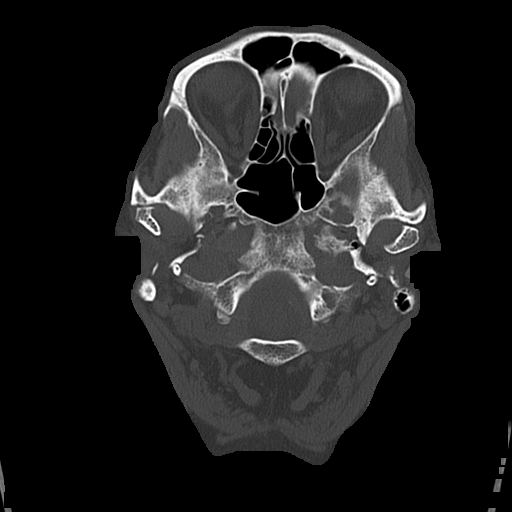
[im 16/78  bone]
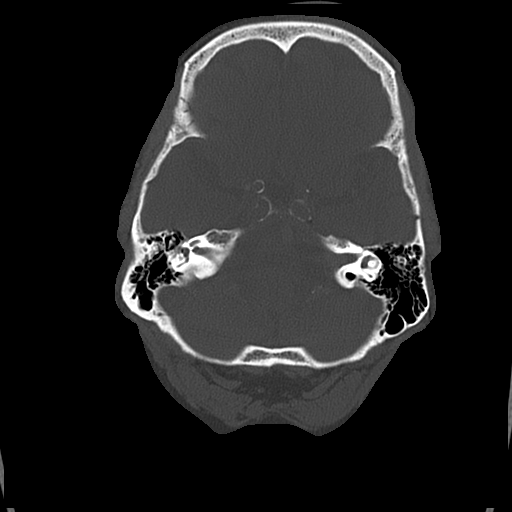
[im 24/78  bone]
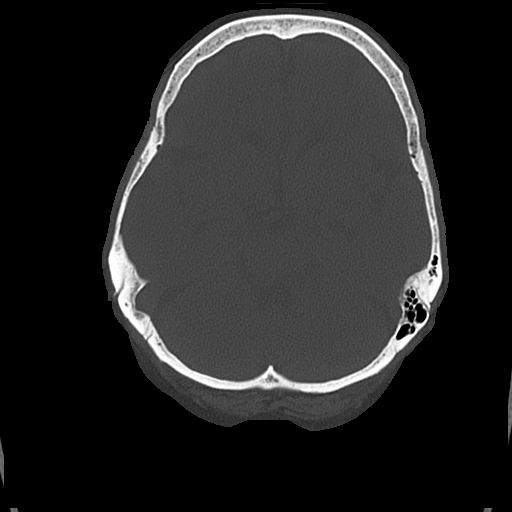

[Series 4: coronal soft · coronal · 0.32mm/px · 3 of 64 slices shown]
[im 22/64  brain]
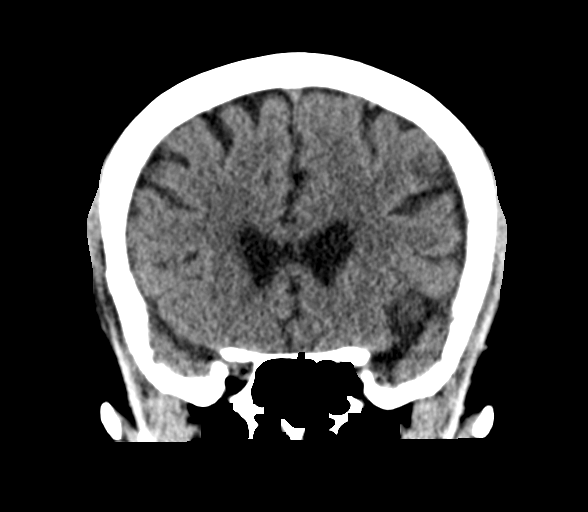
[im 29/64  brain]
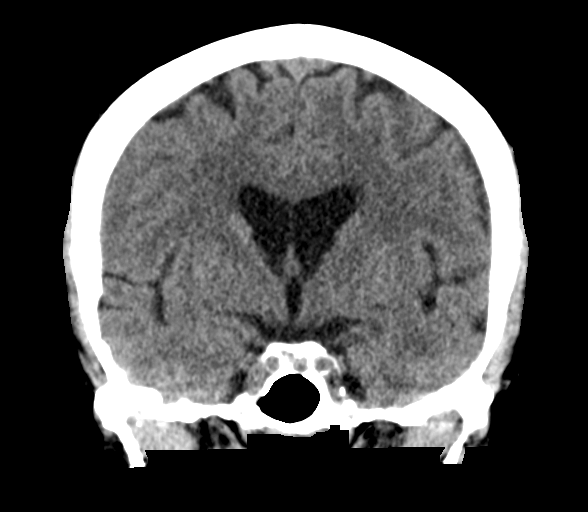
[im 36/64  brain]
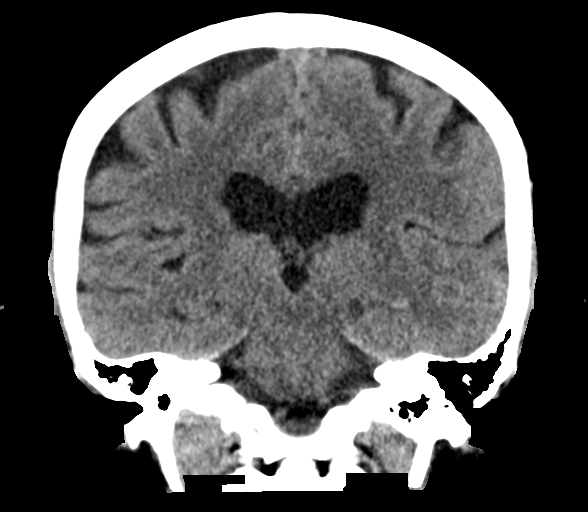

[Series 5: sagittal soft · sagittal · 0.31mm/px · 3 of 63 slices shown]
[im 21/63  brain]
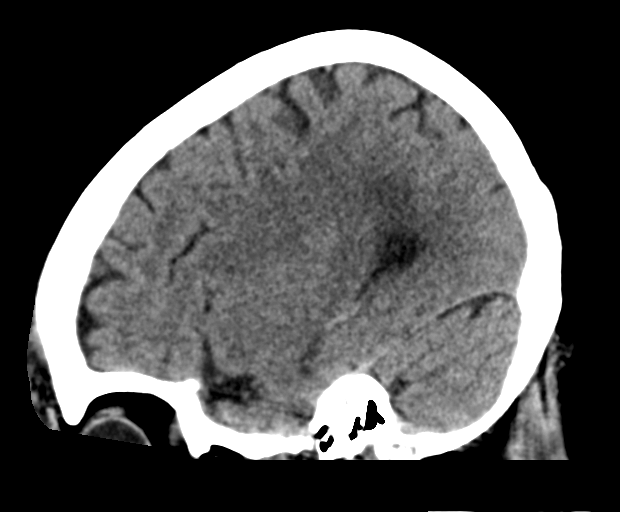
[im 32/63  brain]
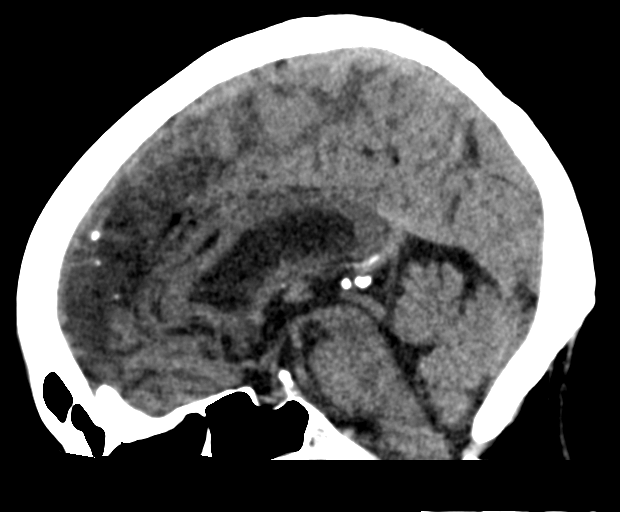
[im 42/63  brain]
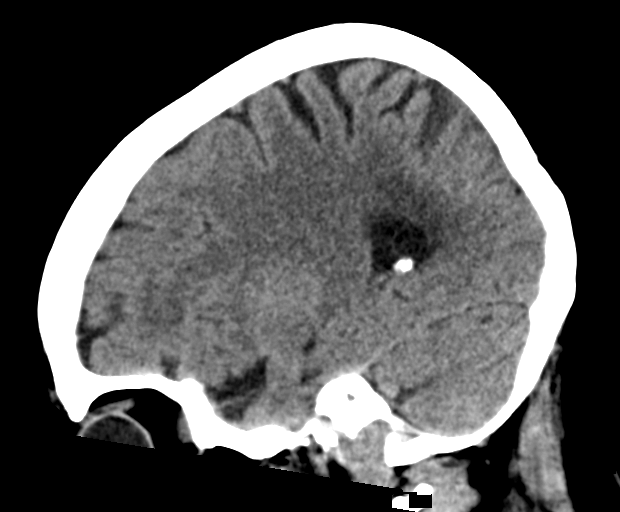

[16 of 47 positions shown; findings below may reference images not displayed]

FINDINGS: CT HEAD FINDINGS

Brain: No evidence of acute infarction, hemorrhage, hydrocephalus,
extra-axial collection or mass lesion/mass effect.

Vascular: No hyperdense vessel or unexpected calcification.

Skull: Normal. Negative for fracture or focal lesion.

Other: None.

CT MAXILLOFACIAL FINDINGS

Osseous: No fracture or mandibular dislocation. No destructive
process.

Orbits: Negative. No traumatic or inflammatory finding.

Sinuses: Clear.

Soft tissues: Mild left periorbital subcutaneous hematoma or
contusion is noted.

CT CERVICAL SPINE FINDINGS

Alignment: Mild grade 1 anterolisthesis of C4-5 is noted secondary
to posterior facet joint hypertrophy.

Skull base and vertebrae: No acute fracture. No primary bone lesion
or focal pathologic process.

Soft tissues and spinal canal: No prevertebral fluid or swelling. No
visible canal hematoma.

Disc levels: Mild degenerative disc disease is noted at C4-5.
Moderate degenerative disc disease is noted at C5-6 and C6-7.

Upper chest: Negative.

Other: None.
IMPRESSION: No acute intracranial abnormality seen.

Mild left periorbital subcutaneous hematoma or contusion is noted.
No other abnormality seen in maxillofacial region.

Multilevel degenerative changes are noted in cervical spine. No
acute abnormality is noted.

## 2023-10-24 DIAGNOSIS — E7800 Pure hypercholesterolemia, unspecified: Secondary | ICD-10-CM | POA: Diagnosis not present

## 2023-10-24 DIAGNOSIS — M810 Age-related osteoporosis without current pathological fracture: Secondary | ICD-10-CM | POA: Diagnosis not present

## 2023-10-24 DIAGNOSIS — E669 Obesity, unspecified: Secondary | ICD-10-CM | POA: Diagnosis not present

## 2023-10-24 DIAGNOSIS — F320 Major depressive disorder, single episode, mild: Secondary | ICD-10-CM | POA: Diagnosis not present

## 2023-11-23 DIAGNOSIS — E669 Obesity, unspecified: Secondary | ICD-10-CM | POA: Diagnosis not present

## 2023-11-23 DIAGNOSIS — F320 Major depressive disorder, single episode, mild: Secondary | ICD-10-CM | POA: Diagnosis not present

## 2023-11-23 DIAGNOSIS — E7800 Pure hypercholesterolemia, unspecified: Secondary | ICD-10-CM | POA: Diagnosis not present

## 2023-11-23 DIAGNOSIS — M810 Age-related osteoporosis without current pathological fracture: Secondary | ICD-10-CM | POA: Diagnosis not present

## 2023-12-24 DIAGNOSIS — E669 Obesity, unspecified: Secondary | ICD-10-CM | POA: Diagnosis not present

## 2023-12-24 DIAGNOSIS — M810 Age-related osteoporosis without current pathological fracture: Secondary | ICD-10-CM | POA: Diagnosis not present

## 2023-12-24 DIAGNOSIS — F320 Major depressive disorder, single episode, mild: Secondary | ICD-10-CM | POA: Diagnosis not present

## 2023-12-24 DIAGNOSIS — E7800 Pure hypercholesterolemia, unspecified: Secondary | ICD-10-CM | POA: Diagnosis not present

## 2024-01-23 DIAGNOSIS — M810 Age-related osteoporosis without current pathological fracture: Secondary | ICD-10-CM | POA: Diagnosis not present

## 2024-01-23 DIAGNOSIS — E7800 Pure hypercholesterolemia, unspecified: Secondary | ICD-10-CM | POA: Diagnosis not present

## 2024-01-23 DIAGNOSIS — F320 Major depressive disorder, single episode, mild: Secondary | ICD-10-CM | POA: Diagnosis not present

## 2024-01-23 DIAGNOSIS — E669 Obesity, unspecified: Secondary | ICD-10-CM | POA: Diagnosis not present
